# Patient Record
Sex: Female | Born: 1950 | Race: White | Hispanic: No | Marital: Married | Smoking: Current every day smoker
Health system: Southern US, Community
[De-identification: ages and names within clinical notes are randomized; demographics above are authoritative.]

## PROBLEM LIST (undated history)

## (undated) ENCOUNTER — Ambulatory Visit: Admission: EM | Payer: Medicare HMO | Source: Home / Self Care

## (undated) ENCOUNTER — Ambulatory Visit: Payer: Medicare HMO

## (undated) DIAGNOSIS — D649 Anemia, unspecified: Secondary | ICD-10-CM

## (undated) DIAGNOSIS — R7303 Prediabetes: Secondary | ICD-10-CM

## (undated) DIAGNOSIS — E785 Hyperlipidemia, unspecified: Secondary | ICD-10-CM

## (undated) DIAGNOSIS — I10 Essential (primary) hypertension: Secondary | ICD-10-CM

## (undated) DIAGNOSIS — M1990 Unspecified osteoarthritis, unspecified site: Secondary | ICD-10-CM

## (undated) DIAGNOSIS — E559 Vitamin D deficiency, unspecified: Secondary | ICD-10-CM

## (undated) DIAGNOSIS — T8859XA Other complications of anesthesia, initial encounter: Secondary | ICD-10-CM

## (undated) DIAGNOSIS — C801 Malignant (primary) neoplasm, unspecified: Secondary | ICD-10-CM

## (undated) DIAGNOSIS — T7840XA Allergy, unspecified, initial encounter: Secondary | ICD-10-CM

## (undated) DIAGNOSIS — F419 Anxiety disorder, unspecified: Secondary | ICD-10-CM

## (undated) DIAGNOSIS — K219 Gastro-esophageal reflux disease without esophagitis: Secondary | ICD-10-CM

## (undated) HISTORY — PX: COLON SURGERY: SHX602

## (undated) HISTORY — PX: EYE SURGERY: SHX253

## (undated) HISTORY — PX: KNEE ARTHROSCOPY W/ ACL RECONSTRUCTION: SHX1858

## (undated) HISTORY — PX: ACHILLES TENDON REPAIR: SUR1153

---

## 1997-12-17 ENCOUNTER — Other Ambulatory Visit: Admission: RE | Admit: 1997-12-17 | Discharge: 1997-12-17 | Payer: Self-pay | Admitting: Family Medicine

## 1998-08-31 ENCOUNTER — Other Ambulatory Visit: Admission: RE | Admit: 1998-08-31 | Discharge: 1998-08-31 | Payer: Self-pay | Admitting: *Deleted

## 2002-10-09 ENCOUNTER — Ambulatory Visit (HOSPITAL_COMMUNITY): Admission: RE | Admit: 2002-10-09 | Discharge: 2002-10-09 | Payer: Self-pay | Admitting: *Deleted

## 2003-12-03 ENCOUNTER — Other Ambulatory Visit: Admission: RE | Admit: 2003-12-03 | Discharge: 2003-12-03 | Payer: Self-pay | Admitting: Family Medicine

## 2004-05-22 ENCOUNTER — Emergency Department (HOSPITAL_COMMUNITY): Admission: EM | Admit: 2004-05-22 | Discharge: 2004-05-22 | Payer: Self-pay | Admitting: Emergency Medicine

## 2004-10-12 ENCOUNTER — Ambulatory Visit: Payer: Self-pay | Admitting: Pulmonary Disease

## 2004-10-20 ENCOUNTER — Ambulatory Visit: Payer: Self-pay | Admitting: Pulmonary Disease

## 2004-12-13 ENCOUNTER — Other Ambulatory Visit: Admission: RE | Admit: 2004-12-13 | Discharge: 2004-12-13 | Payer: Self-pay | Admitting: Family Medicine

## 2005-06-29 ENCOUNTER — Other Ambulatory Visit: Admission: RE | Admit: 2005-06-29 | Discharge: 2005-06-29 | Payer: Self-pay | Admitting: Family Medicine

## 2006-01-02 ENCOUNTER — Other Ambulatory Visit: Admission: RE | Admit: 2006-01-02 | Discharge: 2006-01-02 | Payer: Self-pay | Admitting: Family Medicine

## 2006-09-14 ENCOUNTER — Other Ambulatory Visit: Admission: RE | Admit: 2006-09-14 | Discharge: 2006-09-14 | Payer: Self-pay | Admitting: Family Medicine

## 2007-06-07 ENCOUNTER — Other Ambulatory Visit: Admission: RE | Admit: 2007-06-07 | Discharge: 2007-06-07 | Payer: Self-pay | Admitting: Family Medicine

## 2007-11-26 ENCOUNTER — Other Ambulatory Visit: Admission: RE | Admit: 2007-11-26 | Discharge: 2007-11-26 | Payer: Self-pay | Admitting: Family Medicine

## 2010-03-01 ENCOUNTER — Other Ambulatory Visit: Admission: RE | Admit: 2010-03-01 | Discharge: 2010-03-01 | Payer: Self-pay | Admitting: Family Medicine

## 2011-03-07 ENCOUNTER — Other Ambulatory Visit (HOSPITAL_COMMUNITY)
Admission: RE | Admit: 2011-03-07 | Discharge: 2011-03-07 | Disposition: A | Discharge status: Home / Self Care | Payer: BC Managed Care – PPO | Source: Ambulatory Visit | Attending: Family Medicine | Admitting: Family Medicine

## 2011-03-07 ENCOUNTER — Other Ambulatory Visit: Payer: Self-pay | Admitting: Family Medicine

## 2011-03-07 DIAGNOSIS — Z124 Encounter for screening for malignant neoplasm of cervix: Secondary | ICD-10-CM | POA: Insufficient documentation

## 2012-03-28 ENCOUNTER — Ambulatory Visit: Payer: Self-pay | Admitting: Podiatry

## 2015-08-09 ENCOUNTER — Other Ambulatory Visit: Payer: Self-pay | Admitting: Obstetrics & Gynecology

## 2015-08-09 DIAGNOSIS — M8580 Other specified disorders of bone density and structure, unspecified site: Secondary | ICD-10-CM

## 2015-08-19 ENCOUNTER — Ambulatory Visit: Payer: Self-pay | Attending: Obstetrics & Gynecology

## 2016-12-21 ENCOUNTER — Other Ambulatory Visit: Payer: Self-pay | Admitting: Obstetrics & Gynecology

## 2016-12-21 DIAGNOSIS — Z1231 Encounter for screening mammogram for malignant neoplasm of breast: Secondary | ICD-10-CM

## 2017-01-12 ENCOUNTER — Ambulatory Visit
Admission: RE | Admit: 2017-01-12 | Discharge: 2017-01-12 | Disposition: A | Discharge status: Home / Self Care | Payer: BLUE CROSS/BLUE SHIELD | Source: Ambulatory Visit | Attending: Obstetrics & Gynecology | Admitting: Obstetrics & Gynecology

## 2017-01-12 ENCOUNTER — Encounter: Payer: Self-pay | Admitting: Radiology

## 2017-01-12 DIAGNOSIS — Z1231 Encounter for screening mammogram for malignant neoplasm of breast: Secondary | ICD-10-CM | POA: Diagnosis present

## 2017-01-18 DIAGNOSIS — E559 Vitamin D deficiency, unspecified: Secondary | ICD-10-CM | POA: Insufficient documentation

## 2017-01-18 DIAGNOSIS — Z720 Tobacco use: Secondary | ICD-10-CM | POA: Insufficient documentation

## 2017-01-18 DIAGNOSIS — Z87891 Personal history of nicotine dependence: Secondary | ICD-10-CM | POA: Insufficient documentation

## 2017-04-04 DIAGNOSIS — G8929 Other chronic pain: Secondary | ICD-10-CM | POA: Insufficient documentation

## 2018-04-17 ENCOUNTER — Encounter (HOSPITAL_COMMUNITY): Payer: Self-pay | Admitting: Emergency Medicine

## 2018-04-17 ENCOUNTER — Emergency Department (HOSPITAL_COMMUNITY): Payer: Medicare Other

## 2018-04-17 ENCOUNTER — Emergency Department (HOSPITAL_COMMUNITY)
Admission: EM | Admit: 2018-04-17 | Discharge: 2018-04-17 | Disposition: A | Discharge status: Home / Self Care | Payer: Medicare Other | Attending: Emergency Medicine | Admitting: Emergency Medicine

## 2018-04-17 DIAGNOSIS — R0789 Other chest pain: Secondary | ICD-10-CM | POA: Insufficient documentation

## 2018-04-17 DIAGNOSIS — Z7982 Long term (current) use of aspirin: Secondary | ICD-10-CM | POA: Insufficient documentation

## 2018-04-17 DIAGNOSIS — Z79899 Other long term (current) drug therapy: Secondary | ICD-10-CM | POA: Diagnosis not present

## 2018-04-17 DIAGNOSIS — F17200 Nicotine dependence, unspecified, uncomplicated: Secondary | ICD-10-CM | POA: Diagnosis not present

## 2018-04-17 LAB — BASIC METABOLIC PANEL
Anion gap: 10 (ref 5–15)
BUN: 13 mg/dL (ref 8–23)
CO2: 22 mmol/L (ref 22–32)
Calcium: 9.4 mg/dL (ref 8.9–10.3)
Chloride: 109 mmol/L (ref 98–111)
Creatinine, Ser: 0.9 mg/dL (ref 0.44–1.00)
GFR calc Af Amer: >60 mL/min (ref >60)
GFR calc non Af Amer: >60 mL/min (ref >60)
Glucose, Bld: 179 mg/dL — ABNORMAL HIGH (ref 70–99)
Potassium: 3.8 mmol/L (ref 3.5–5.1)
Sodium: 141 mmol/L (ref 135–145)

## 2018-04-17 LAB — CBC
HCT: 50.3 % — ABNORMAL HIGH (ref 36.0–46.0)
Hemoglobin: 16.7 g/dL — ABNORMAL HIGH (ref 12.0–15.0)
MCH: 33.2 pg (ref 26.0–34.0)
MCHC: 33.2 g/dL (ref 30.0–36.0)
MCV: 100 fL (ref 78.0–100.0)
Platelets: 231 10*3/uL (ref 150–400)
RBC: 5.03 MIL/uL (ref 3.87–5.11)
RDW: 12.7 % (ref 11.5–15.5)
WBC: 10.7 10*3/uL — ABNORMAL HIGH (ref 4.0–10.5)

## 2018-04-17 LAB — I-STAT TROPONIN, ED: Troponin i, poc: 0.01 ng/mL (ref 0.00–0.08)

## 2018-04-17 LAB — TROPONIN I: Troponin I: <0.03 ng/mL (ref <0.03)

## 2018-04-17 MED ORDER — IOPAMIDOL (ISOVUE-370) INJECTION 76%
100.0000 mL | Freq: Once | INTRAVENOUS | Status: AC | PRN
  Administered 2018-04-17: 100 mL via INTRAVENOUS

## 2018-04-17 MED ORDER — IOPAMIDOL (ISOVUE-370) INJECTION 76%
INTRAVENOUS | Status: AC
  Filled 2018-04-17: qty 100

## 2018-04-17 MED ORDER — ALPRAZOLAM 0.25 MG PO TABS: 0.2500 mg | ORAL_TABLET | Freq: Two times a day (BID) | ORAL | 0 refills | Status: DC | PRN

## 2018-04-17 MED ORDER — CYCLOBENZAPRINE HCL 10 MG PO TABS
5.0000 mg | ORAL_TABLET | Freq: Once | ORAL | Status: AC
  Administered 2018-04-17: 5 mg via ORAL
  Filled 2018-04-17: qty 1

## 2018-04-17 NOTE — ED Notes (Signed)
Brenda(RN) stated pt needs IV and will draw blood.

## 2018-04-17 NOTE — ED Provider Notes (Signed)
[facility] EMERGENCY DEPARTMENT Provider Note   CSN: [id number] Arrival date & time: 04/17/18  1319     History   Chief Complaint Chief Complaint  Patient presents with  . Chest Pain    HPI Neisha TAJ ARTEAGA is a 67 y.o. female history of chest pain.  Patient states that she has some substernal chest pain started at 10 AM this morning.  She states that it is worse with exertion also pleuritic in nature.  Patient denies any abdominal pain or vomiting or fevers.  Patient denies any recent travel or leg swelling or history of blood clots.  Patient denies any history of coronary artery disease or stents.  Patient was noted to be tachycardic in triage.  The history is provided by the patient.    History reviewed. No pertinent past medical history.  There are no active problems to display for this patient.   History reviewed. No pertinent surgical history.   OB History   None      Home Medications    Prior to Admission medications   Medication Sig Start Date End Date Taking? Authorizing Provider  aspirin 81 MG chewable tablet Chew 81 mg by mouth once as needed (for sudden onset of chest pain).    Yes [provider]  Cholecalciferol (VITAMIN D3) 5000 units CAPS Take 5,000 Units by mouth at bedtime.   Yes [provider]  ibuprofen (ADVIL,MOTRIN) 200 MG tablet Take 200 mg by mouth every 6 (six) hours as needed (for pain or headaches).   Yes [provider]  NON FORMULARY Place 5 drops under the tongue See admin instructions. CBD oil (contains NO "THC"): Place 5 drops under the tongue in the morning and 5 drops at bedtime   Yes [provider]  Propylene Glycol (SYSTANE BALANCE OP) Place 1-2 drops into both eyes 2 (two) times daily as needed (for dryness or irritation).   Yes [provider]  simvastatin (ZOCOR) 20 MG tablet Take 20 mg by mouth at bedtime. 01/18/18  Yes [provider]    Family History No  family history on file.  Social History Social History   Tobacco Use  . Smoking status: Current Every Day Smoker  . Smokeless tobacco: Never Used  Substance Use Topics  . Alcohol use: Never    Frequency: Never  . Drug use: Not on file     Allergies   Patient has no known allergies.   Review of Systems Review of Systems  Cardiovascular: Positive for chest pain.  All other systems reviewed and are negative.    Physical Exam Updated Vital Signs BP 140/82   Pulse 73   Temp 98.3 F (36.8 C) (Oral)   Resp 20   SpO2 98%   Physical Exam  Constitutional: She appears well-developed and well-nourished.  HENT:  Head: Normocephalic.  Eyes: Pupils are equal, round, and reactive to light. EOM are normal.  Neck: Normal range of motion.  Cardiovascular: Normal rate, regular rhythm and normal pulses.  Pulmonary/Chest: Effort normal and breath sounds normal.  ? Reproducible tenderness   Abdominal: Soft. Bowel sounds are normal.  Musculoskeletal: Normal range of motion.       Right lower leg: Normal.       Left lower leg: Normal.  Neurological: She is alert.  Skin: Skin is warm. Capillary refill takes less than 2 seconds.  Psychiatric: She has a normal mood and affect. Her behavior is normal.  Nursing note and vitals reviewed.  ED Treatments / Results  Labs (all labs ordered are listed, but only abnormal results are displayed) Labs Reviewed  BASIC METABOLIC PANEL - Abnormal; Notable for the following components:      Result Value   Glucose, Bld 179 (*)    All other components within normal limits  CBC - Abnormal; Notable for the following components:   WBC 10.7 (*)    Hemoglobin 16.7 (*)    HCT 50.3 (*)    All other components within normal limits  TROPONIN I  I-STAT TROPONIN, ED  I-STAT TROPONIN, ED    EKG EKG Interpretation  Date/Time:  Wednesday April 17 2018 13:25:36 EDT Ventricular Rate:  101 PR Interval:  138 QRS Duration: 78 QT Interval:  334 QTC  Calculation: 433 R Axis:   86 Text Interpretation:  Sinus tachycardia Otherwise normal ECG No previous ECGs available Confirmed by Wandra Arthurs [phone]) on 04/17/2018 3:13:16 PM   Radiology Dg Chest 2 View  Result Date: 04/17/2018 CLINICAL DATA:  Chest pain and pressure EXAM: CHEST - 2 VIEW COMPARISON:  None. FINDINGS: The heart size and mediastinal contours are within normal limits. Both lungs are clear. The visualized skeletal structures are unremarkable. IMPRESSION: No active cardiopulmonary disease. Electronically Signed   By: Inez Catalina M.D.   On: 04/17/2018 14:29   Ct Angio Chest Pe W And/or Wo Contrast  Result Date: 04/17/2018 CLINICAL DATA:  Chest pain and shortness of breath. Evaluate for pulmonary embolism. EXAM: CT ANGIOGRAPHY CHEST WITH CONTRAST TECHNIQUE: Multidetector CT imaging of the chest was performed using the standard protocol during bolus administration of intravenous contrast. Multiplanar CT image reconstructions and MIPs were obtained to evaluate the vascular anatomy. CONTRAST:  158mL ISOVUE-370 IOPAMIDOL (ISOVUE-370) COMPARISON:  Chest radiograph-earlier same date FINDINGS: Vascular Findings: There is adequate opacification of the pulmonary arterial system with the main pulmonary artery measuring 450 Hounsfield units. There are no discrete filling defects within the pulmonary arterial tree to suggest pulmonary embolism. Normal caliber of the main pulmonary artery. Cardiomegaly. Coronary artery calcifications. There is a small amount of fluid within the pericardial recess. No pericardial effusion. Normal caliber the thoracic aorta. Review of the MIP images confirms the above findings. ---------------------------------------------------------------------------------- Nonvascular Findings: Mediastinum/Lymph Nodes: Scattered mediastinal and hilar lymph nodes are numerous though individually not enlarged by size criteria with index subcarinal lymph node measuring 0.8 cm in greatest  short axis diameter (image 45, series 5) and index right infrahilar lymph node measuring 0.6 cm (image 60, series 5), presumably reactive in etiology. No bulky mediastinal, hilar or axillary lymphadenopathy. Lungs/Pleura: Scattered ill-defined areas of ground-glass favored to represent areas of air trapping. Minimal dependent subpleural ground-glass atelectasis. No discrete focal airspace opacities. No pleural effusion or pneumothorax. The central pulmonary airways appear widely patent. No discrete pulmonary nodules. Upper abdomen: Limited early arterial phase evaluation of the upper abdomen suggests hepatomegaly. Musculoskeletal: No acute or aggressive osseous abnormalities. Stigmata of DISH within the thoracic spine. Regional soft tissues appear normal. Normal appearance of the imaged portions of the thyroid gland. IMPRESSION: 1. No explanation for patient's chest pain and shortness of breath. Specifically, no evidence of pulmonary embolism. 2. Cardiomegaly.  Coronary artery calcifications. 3.  Aortic Atherosclerosis (ICD10-I70.0). Electronically Signed   By: Sandi Mariscal M.D.   On: 04/17/2018 16:57    Procedures Procedures (including critical care time)  Medications Ordered in ED Medications  iopamidol (ISOVUE-370) 76 % injection (has no administration in time range)  cyclobenzaprine (FLEXERIL) tablet 5 mg (5 mg Oral Given  04/17/18 1547)  iopamidol (ISOVUE-370) 76 % injection 100 mL (100 mLs Intravenous Contrast Given 04/17/18 1619)     Initial Impression / Assessment and Plan / ED Course  I have reviewed the triage vital signs and the nursing notes.  Pertinent labs & imaging results that were available during my care of the patient were reviewed by me and considered in my medical decision making (see chart for details).    Amonda CARLIN MAMONE is a 67 y.o. female here with chest pain, tachycardia. Consider ACS vs PE vs MSK pain. Will get trop x 2, CTA chest.   6:13 PM CT showed no PE. Delta trop  neg. I think likely MSK pain vs anxiety. Stable for discharge.   Final Clinical Impressions(s) / ED Diagnoses   Final diagnoses:  None    ED Discharge Orders    None       Drenda Freeze, MD 04/17/18 [phone]

## 2018-04-17 NOTE — Discharge Instructions (Signed)
Take motrin for pain.   Take xanax as needed for anxiety.   See your doctor. Consider stress test if you have persistent pain   Return to ER if you have worse chest pain, shortness of breath.

## 2018-04-17 NOTE — ED Triage Notes (Signed)
Pt to ER for evaluation of sudden onset central chest pressure while at rest 1 hour ago. Pt reports shortness of breath. Hx of high cholesterol and is current everyday smoker. HR 110-115. Pt in NAD at this time.

## 2018-06-21 DIAGNOSIS — I700 Atherosclerosis of aorta: Secondary | ICD-10-CM | POA: Insufficient documentation

## 2018-12-25 ENCOUNTER — Other Ambulatory Visit: Payer: Self-pay | Admitting: Obstetrics & Gynecology

## 2018-12-25 DIAGNOSIS — Z1231 Encounter for screening mammogram for malignant neoplasm of breast: Secondary | ICD-10-CM

## 2019-02-27 ENCOUNTER — Other Ambulatory Visit: Payer: Self-pay

## 2019-02-27 ENCOUNTER — Ambulatory Visit
Admission: RE | Admit: 2019-02-27 | Discharge: 2019-02-27 | Disposition: A | Discharge status: Home / Self Care | Payer: Medicare Other | Source: Ambulatory Visit | Attending: Obstetrics & Gynecology | Admitting: Obstetrics & Gynecology

## 2019-02-27 DIAGNOSIS — Z1231 Encounter for screening mammogram for malignant neoplasm of breast: Secondary | ICD-10-CM

## 2019-08-14 IMAGING — DX DG CHEST 2V
2 series · 2 of 2 positions shown · non-contrast
Comparison: None.

CLINICAL DATA: Chest pain and pressure

EXAM:
CHEST - 2 VIEW

[chest pa]
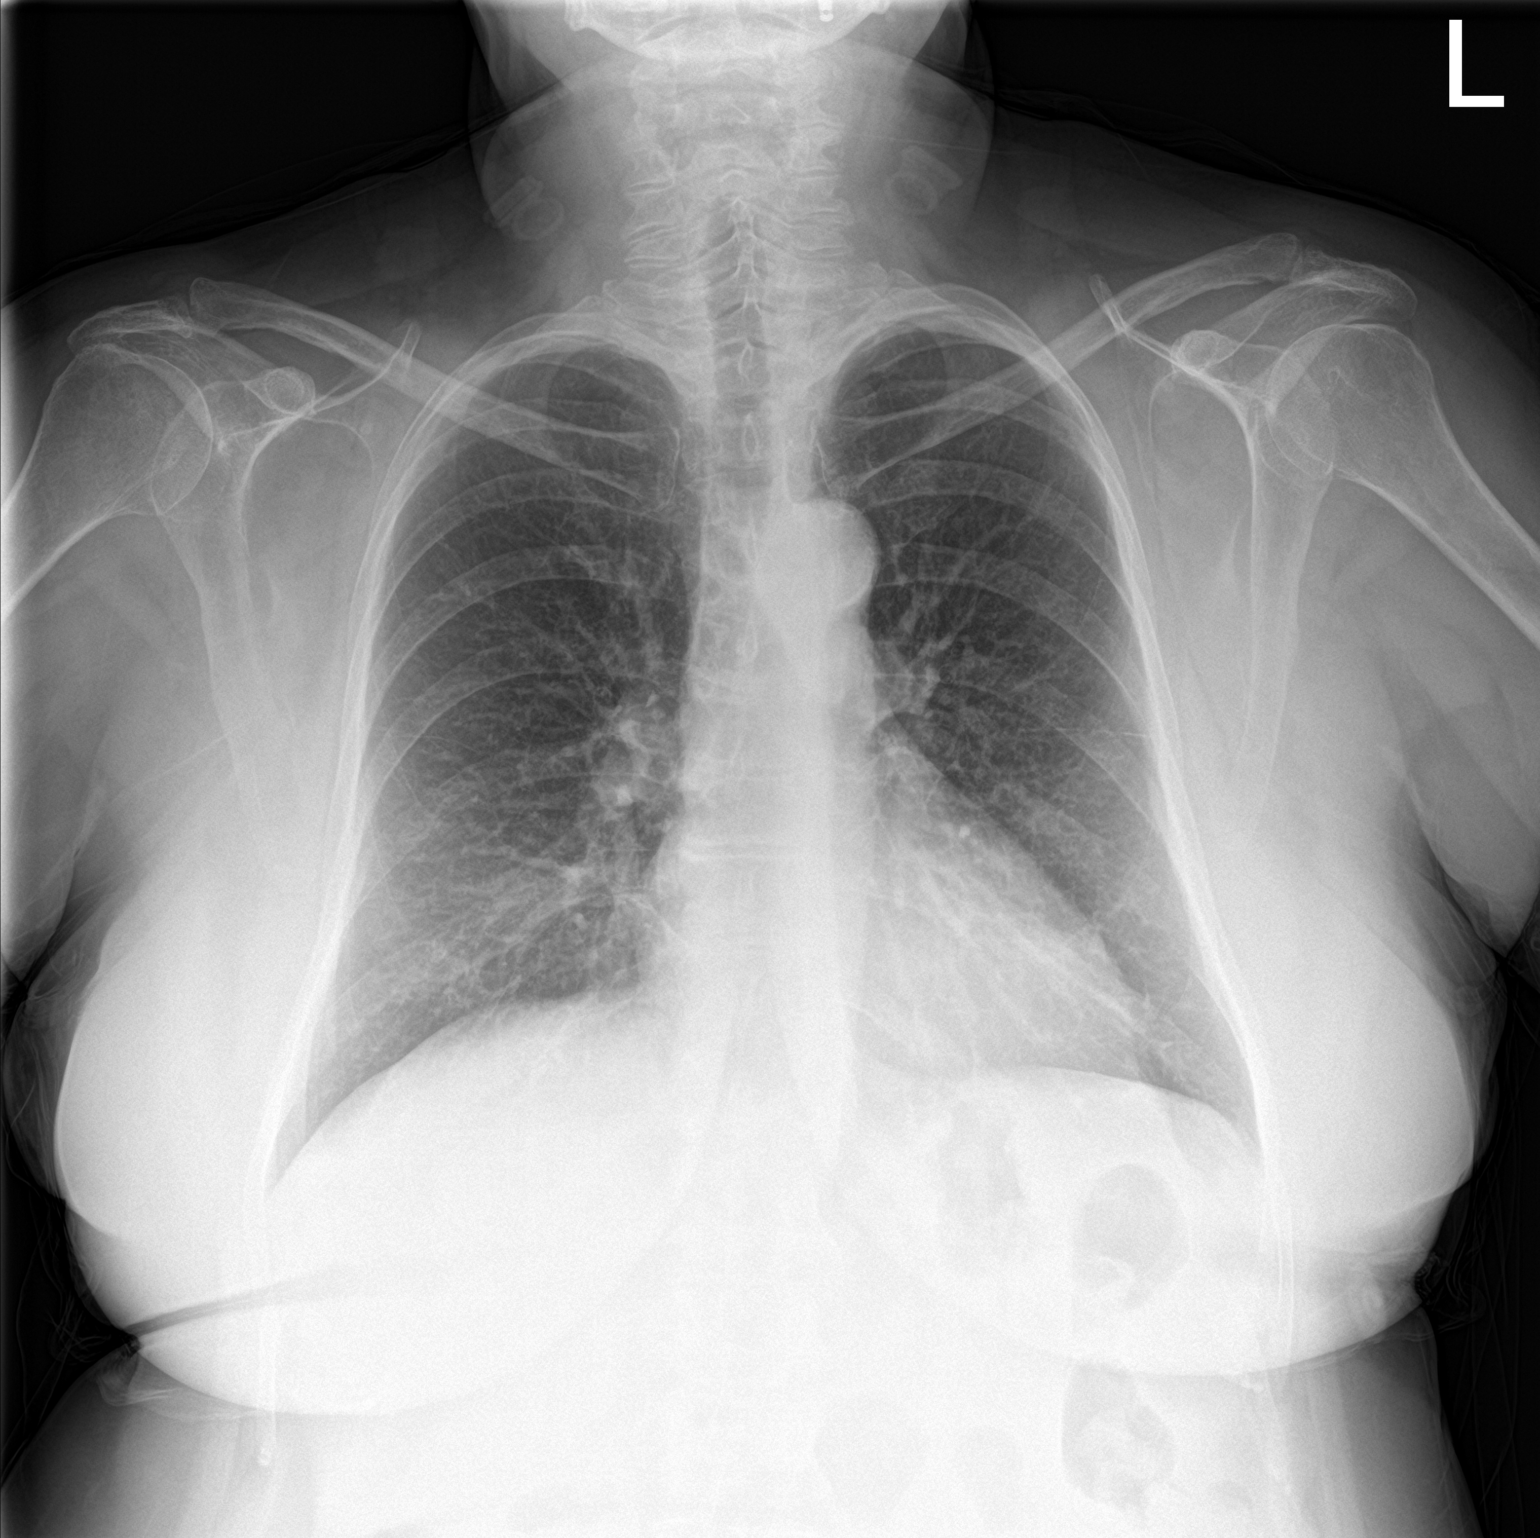

[chest lat]
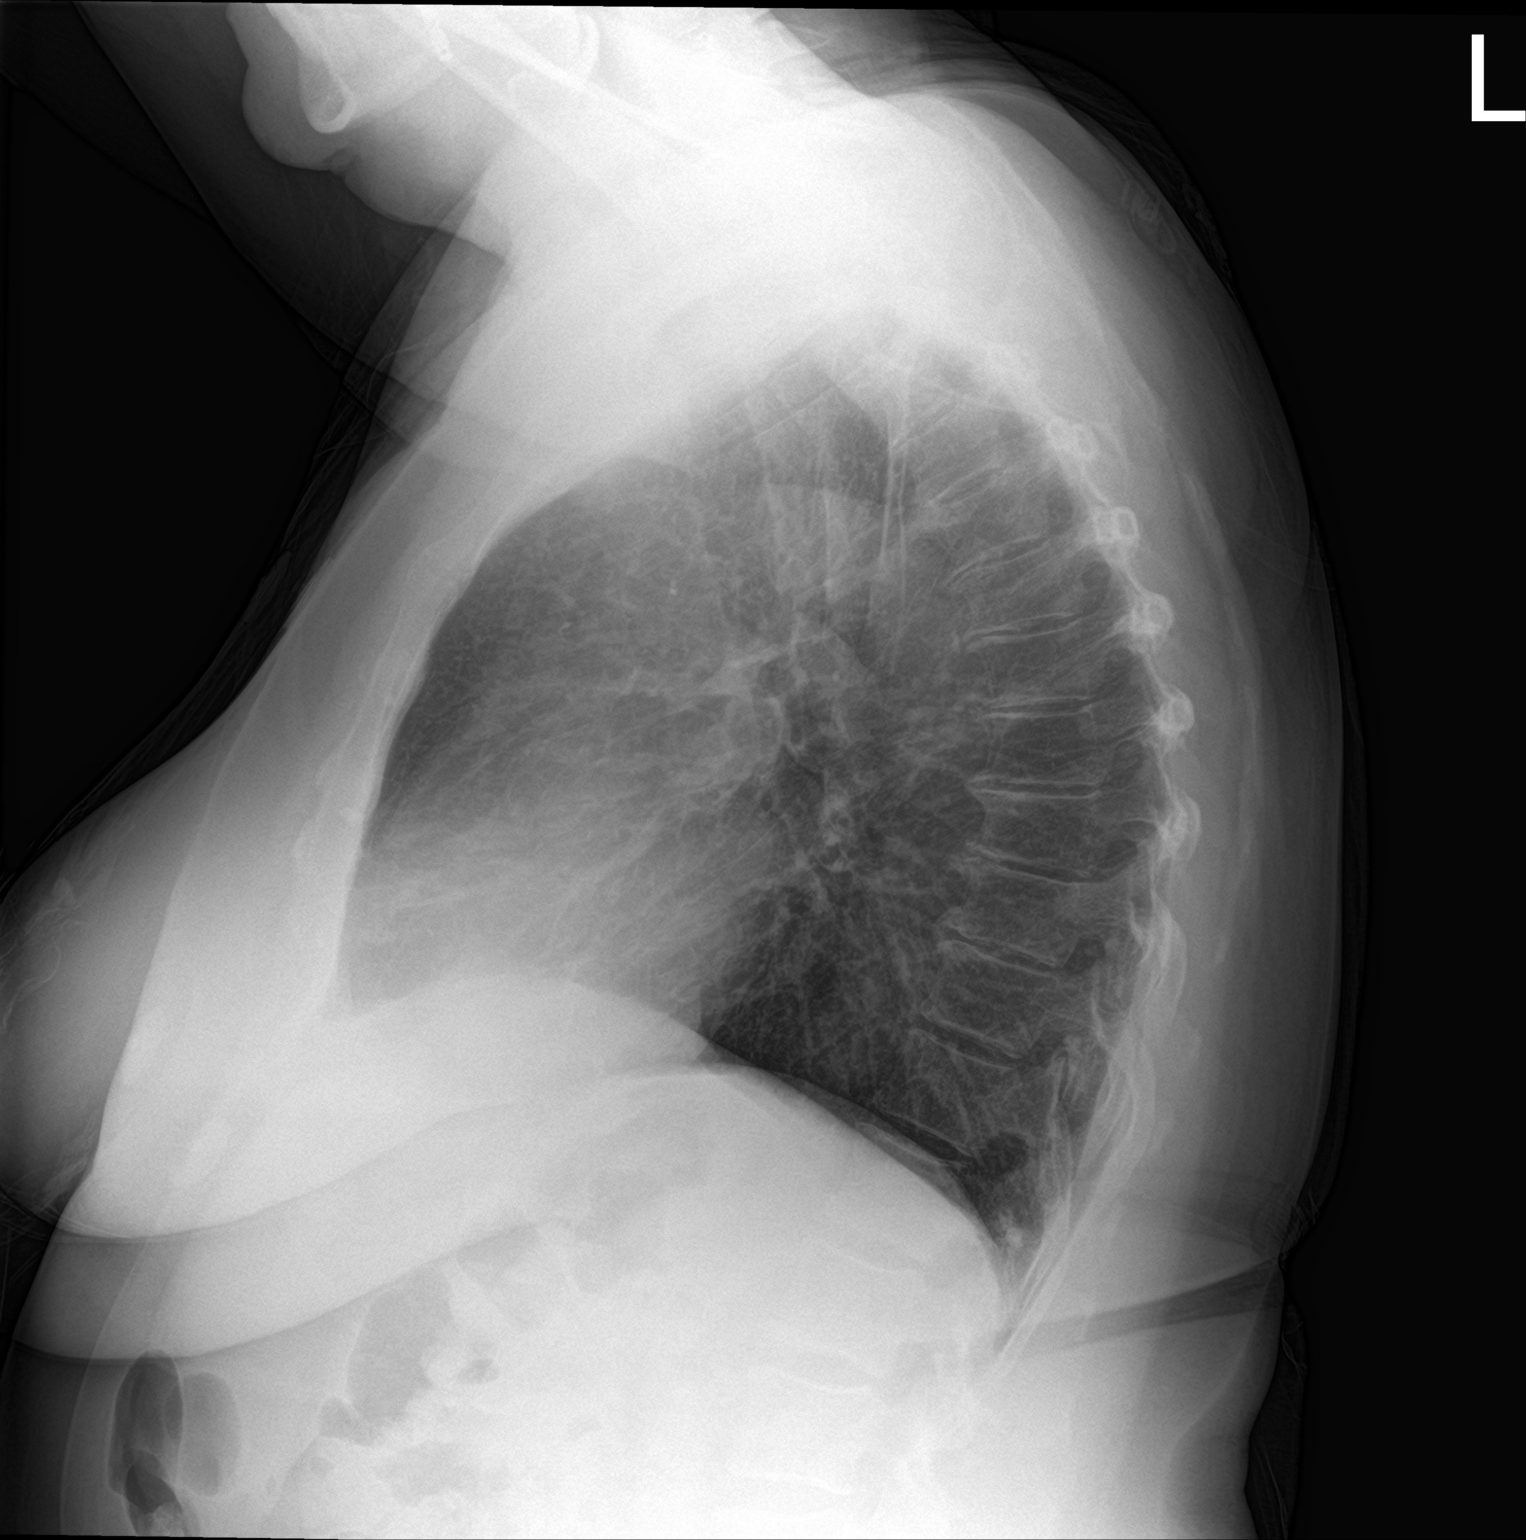

[2 of 2 positions shown; findings below may reference images not displayed]

FINDINGS: The heart size and mediastinal contours are within normal limits.
Both lungs are clear. The visualized skeletal structures are
unremarkable.
IMPRESSION: No active cardiopulmonary disease.

## 2019-09-02 ENCOUNTER — Ambulatory Visit: Payer: Medicare Other | Attending: Internal Medicine

## 2019-09-02 DIAGNOSIS — Z20822 Contact with and (suspected) exposure to covid-19: Secondary | ICD-10-CM

## 2019-09-04 LAB — NOVEL CORONAVIRUS, NAA: SARS-CoV-2, NAA: NOT DETECTED

## 2020-06-16 ENCOUNTER — Other Ambulatory Visit: Payer: Self-pay | Admitting: Obstetrics & Gynecology

## 2020-06-16 DIAGNOSIS — Z1231 Encounter for screening mammogram for malignant neoplasm of breast: Secondary | ICD-10-CM

## 2020-07-23 ENCOUNTER — Ambulatory Visit
Admission: RE | Admit: 2020-07-23 | Discharge: 2020-07-23 | Disposition: A | Discharge status: Home / Self Care | Payer: Medicare Other | Source: Ambulatory Visit | Attending: Obstetrics & Gynecology | Admitting: Obstetrics & Gynecology

## 2020-07-23 ENCOUNTER — Other Ambulatory Visit: Payer: Self-pay

## 2020-07-23 DIAGNOSIS — Z1231 Encounter for screening mammogram for malignant neoplasm of breast: Secondary | ICD-10-CM | POA: Insufficient documentation

## 2020-08-24 DIAGNOSIS — Z0100 Encounter for examination of eyes and vision without abnormal findings: Secondary | ICD-10-CM | POA: Diagnosis not present

## 2020-08-24 DIAGNOSIS — H524 Presbyopia: Secondary | ICD-10-CM | POA: Diagnosis not present

## 2020-09-01 DIAGNOSIS — Z20822 Contact with and (suspected) exposure to covid-19: Secondary | ICD-10-CM | POA: Diagnosis not present

## 2020-11-21 DIAGNOSIS — Z03818 Encounter for observation for suspected exposure to other biological agents ruled out: Secondary | ICD-10-CM | POA: Diagnosis not present

## 2020-11-21 DIAGNOSIS — J302 Other seasonal allergic rhinitis: Secondary | ICD-10-CM | POA: Diagnosis not present

## 2020-11-21 DIAGNOSIS — Z20822 Contact with and (suspected) exposure to covid-19: Secondary | ICD-10-CM | POA: Diagnosis not present

## 2020-11-21 DIAGNOSIS — J0140 Acute pansinusitis, unspecified: Secondary | ICD-10-CM | POA: Diagnosis not present

## 2020-12-07 DIAGNOSIS — Z720 Tobacco use: Secondary | ICD-10-CM | POA: Diagnosis not present

## 2020-12-07 DIAGNOSIS — E7849 Other hyperlipidemia: Secondary | ICD-10-CM | POA: Diagnosis not present

## 2020-12-07 DIAGNOSIS — Z0000 Encounter for general adult medical examination without abnormal findings: Secondary | ICD-10-CM | POA: Diagnosis not present

## 2020-12-07 DIAGNOSIS — I700 Atherosclerosis of aorta: Secondary | ICD-10-CM | POA: Diagnosis not present

## 2020-12-12 DIAGNOSIS — Z03818 Encounter for observation for suspected exposure to other biological agents ruled out: Secondary | ICD-10-CM | POA: Diagnosis not present

## 2020-12-12 DIAGNOSIS — Z20822 Contact with and (suspected) exposure to covid-19: Secondary | ICD-10-CM | POA: Diagnosis not present

## 2020-12-12 DIAGNOSIS — J0141 Acute recurrent pansinusitis: Secondary | ICD-10-CM | POA: Diagnosis not present

## 2020-12-14 DIAGNOSIS — E559 Vitamin D deficiency, unspecified: Secondary | ICD-10-CM | POA: Diagnosis not present

## 2020-12-14 DIAGNOSIS — R1084 Generalized abdominal pain: Secondary | ICD-10-CM | POA: Diagnosis not present

## 2020-12-14 DIAGNOSIS — E785 Hyperlipidemia, unspecified: Secondary | ICD-10-CM | POA: Diagnosis not present

## 2020-12-14 DIAGNOSIS — I700 Atherosclerosis of aorta: Secondary | ICD-10-CM | POA: Diagnosis not present

## 2020-12-14 DIAGNOSIS — Z720 Tobacco use: Secondary | ICD-10-CM | POA: Diagnosis not present

## 2020-12-14 DIAGNOSIS — Z7982 Long term (current) use of aspirin: Secondary | ICD-10-CM | POA: Diagnosis not present

## 2020-12-21 ENCOUNTER — Other Ambulatory Visit: Payer: Self-pay | Admitting: Gastroenterology

## 2020-12-21 DIAGNOSIS — I700 Atherosclerosis of aorta: Secondary | ICD-10-CM | POA: Diagnosis not present

## 2020-12-21 DIAGNOSIS — Z1211 Encounter for screening for malignant neoplasm of colon: Secondary | ICD-10-CM | POA: Diagnosis not present

## 2020-12-21 DIAGNOSIS — R1319 Other dysphagia: Secondary | ICD-10-CM | POA: Diagnosis not present

## 2020-12-21 DIAGNOSIS — R634 Abnormal weight loss: Secondary | ICD-10-CM | POA: Diagnosis not present

## 2020-12-21 DIAGNOSIS — R1084 Generalized abdominal pain: Secondary | ICD-10-CM

## 2020-12-21 DIAGNOSIS — K219 Gastro-esophageal reflux disease without esophagitis: Secondary | ICD-10-CM | POA: Diagnosis not present

## 2020-12-21 DIAGNOSIS — R6881 Early satiety: Secondary | ICD-10-CM | POA: Diagnosis not present

## 2020-12-30 ENCOUNTER — Other Ambulatory Visit: Payer: Self-pay

## 2020-12-30 ENCOUNTER — Ambulatory Visit
Admission: RE | Admit: 2020-12-30 | Discharge: 2020-12-30 | Disposition: A | Discharge status: Home / Self Care | Payer: Medicare HMO | Source: Ambulatory Visit | Attending: Gastroenterology | Admitting: Gastroenterology

## 2020-12-30 DIAGNOSIS — R634 Abnormal weight loss: Secondary | ICD-10-CM | POA: Diagnosis not present

## 2020-12-30 DIAGNOSIS — R109 Unspecified abdominal pain: Secondary | ICD-10-CM | POA: Diagnosis not present

## 2020-12-30 DIAGNOSIS — R1084 Generalized abdominal pain: Secondary | ICD-10-CM | POA: Insufficient documentation

## 2020-12-30 DIAGNOSIS — I700 Atherosclerosis of aorta: Secondary | ICD-10-CM

## 2020-12-30 MED ORDER — IOHEXOL 300 MG/ML  SOLN
100.0000 mL | Freq: Once | INTRAMUSCULAR | Status: AC | PRN
  Administered 2020-12-30: 100 mL via INTRAVENOUS

## 2020-12-31 ENCOUNTER — Encounter: Payer: Self-pay | Admitting: Internal Medicine

## 2021-01-03 ENCOUNTER — Ambulatory Visit
Admission: RE | Admit: 2021-01-03 | Discharge: 2021-01-03 | Disposition: A | Discharge status: Home / Self Care | Payer: Medicare HMO | Attending: Internal Medicine | Admitting: Internal Medicine

## 2021-01-03 ENCOUNTER — Encounter
Admission: RE | Disposition: A | Discharge status: Home / Self Care | Payer: Self-pay | Source: Home / Self Care | Attending: Internal Medicine

## 2021-01-03 ENCOUNTER — Ambulatory Visit: Payer: Medicare HMO | Admitting: Anesthesiology

## 2021-01-03 ENCOUNTER — Encounter: Payer: Self-pay | Admitting: Internal Medicine

## 2021-01-03 DIAGNOSIS — Z79899 Other long term (current) drug therapy: Secondary | ICD-10-CM | POA: Diagnosis not present

## 2021-01-03 DIAGNOSIS — K648 Other hemorrhoids: Secondary | ICD-10-CM | POA: Diagnosis not present

## 2021-01-03 DIAGNOSIS — K2289 Other specified disease of esophagus: Secondary | ICD-10-CM | POA: Insufficient documentation

## 2021-01-03 DIAGNOSIS — K5669 Other partial intestinal obstruction: Secondary | ICD-10-CM | POA: Diagnosis not present

## 2021-01-03 DIAGNOSIS — K631 Perforation of intestine (nontraumatic): Secondary | ICD-10-CM | POA: Diagnosis not present

## 2021-01-03 DIAGNOSIS — R1314 Dysphagia, pharyngoesophageal phase: Secondary | ICD-10-CM | POA: Diagnosis not present

## 2021-01-03 DIAGNOSIS — K621 Rectal polyp: Secondary | ICD-10-CM | POA: Diagnosis not present

## 2021-01-03 DIAGNOSIS — R1084 Generalized abdominal pain: Secondary | ICD-10-CM | POA: Diagnosis present

## 2021-01-03 DIAGNOSIS — K219 Gastro-esophageal reflux disease without esophagitis: Secondary | ICD-10-CM | POA: Diagnosis not present

## 2021-01-03 DIAGNOSIS — R635 Abnormal weight gain: Secondary | ICD-10-CM | POA: Insufficient documentation

## 2021-01-03 DIAGNOSIS — C182 Malignant neoplasm of ascending colon: Secondary | ICD-10-CM | POA: Insufficient documentation

## 2021-01-03 DIAGNOSIS — D128 Benign neoplasm of rectum: Secondary | ICD-10-CM | POA: Insufficient documentation

## 2021-01-03 DIAGNOSIS — K640 First degree hemorrhoids: Secondary | ICD-10-CM | POA: Diagnosis not present

## 2021-01-03 DIAGNOSIS — K449 Diaphragmatic hernia without obstruction or gangrene: Secondary | ICD-10-CM | POA: Diagnosis not present

## 2021-01-03 DIAGNOSIS — K6389 Other specified diseases of intestine: Secondary | ICD-10-CM | POA: Diagnosis not present

## 2021-01-03 DIAGNOSIS — K209 Esophagitis, unspecified without bleeding: Secondary | ICD-10-CM | POA: Diagnosis not present

## 2021-01-03 HISTORY — DX: Vitamin D deficiency, unspecified: E55.9

## 2021-01-03 HISTORY — DX: Unspecified osteoarthritis, unspecified site: M19.90

## 2021-01-03 HISTORY — PX: COLONOSCOPY WITH PROPOFOL: SHX5780

## 2021-01-03 HISTORY — PX: ESOPHAGOGASTRODUODENOSCOPY (EGD) WITH PROPOFOL: SHX5813

## 2021-01-03 HISTORY — DX: Gastro-esophageal reflux disease without esophagitis: K21.9

## 2021-01-03 HISTORY — DX: Allergy, unspecified, initial encounter: T78.40XA

## 2021-01-03 HISTORY — DX: Hyperlipidemia, unspecified: E78.5

## 2021-01-03 SURGERY — COLONOSCOPY WITH PROPOFOL
Anesthesia: General

## 2021-01-03 MED ORDER — LIDOCAINE 2% (20 MG/ML) 5 ML SYRINGE
INTRAMUSCULAR | Status: DC | PRN
  Administered 2021-01-03: 25 mg via INTRAVENOUS

## 2021-01-03 MED ORDER — SODIUM CHLORIDE 0.9 % IV SOLN: INTRAVENOUS | Status: DC

## 2021-01-03 MED ORDER — PROPOFOL 10 MG/ML IV BOLUS
INTRAVENOUS | Status: DC | PRN
  Administered 2021-01-03: 30 mg via INTRAVENOUS
  Administered 2021-01-03: 70 mg via INTRAVENOUS

## 2021-01-03 MED ORDER — EPHEDRINE SULFATE 50 MG/ML IJ SOLN
INTRAMUSCULAR | Status: DC | PRN
  Administered 2021-01-03 (×2): 10 mg via INTRAVENOUS

## 2021-01-03 MED ORDER — SPOT INK MARKER SYRINGE KIT
PACK | SUBMUCOSAL | Status: DC | PRN
  Administered 2021-01-03: 9 mL via SUBMUCOSAL

## 2021-01-03 MED ORDER — GLYCOPYRROLATE 0.2 MG/ML IJ SOLN
INTRAMUSCULAR | Status: DC | PRN
  Administered 2021-01-03: .2 mg via INTRAVENOUS

## 2021-01-03 MED ORDER — PROPOFOL 500 MG/50ML IV EMUL
INTRAVENOUS | Status: DC | PRN
  Administered 2021-01-03: 120 ug/kg/min via INTRAVENOUS

## 2021-01-03 NOTE — Transfer of Care (Signed)
Immediate Anesthesia Transfer of Care Note  Patient: Katie Liu  Procedure(s) Performed: COLONOSCOPY WITH PROPOFOL (N/A ) ESOPHAGOGASTRODUODENOSCOPY (EGD) WITH PROPOFOL (N/A )  Patient Location: PACU  Anesthesia Type:General  Level of Consciousness: sedated  Airway & Oxygen Therapy: Patient Spontanous Breathing and Patient connected to nasal cannula oxygen  Post-op Assessment: Report given to RN and Post -op Vital signs reviewed and stable  Post vital signs: Reviewed and stable  Last Vitals:  Vitals Value Taken Time  BP    Temp    Pulse 86 01/03/21 1510  Resp 29 01/03/21 1510  SpO2 98 % 01/03/21 1510  Vitals shown include unvalidated device data.  Last Pain:  Vitals:   01/03/21 1333  TempSrc: Tympanic  PainSc: 0-No pain         Complications: No complications documented.

## 2021-01-03 NOTE — H&P (Signed)
Outpatient short stay form Pre-procedure 01/03/2021 2:33 PM Katie Liu K. Katie Liu, M.D.  Primary Physician: Ramonita Lab III, M.D.  Reason for visit:  Postprandial diarrhea, abdominal pain, Sitophobia, weight loss. Abnormal CT scan of the abdomen.   History of present illness:  Patient is a pleasant 70 y/o female with > 20lb weight loss with postprandial abdominal pain and diarrhea. Had a CT showing thickened cecum suspicious for possible neoplasm. Patient has GERD controlled on medication with intermittent solid food dysphagia.    Current Facility-Administered Medications:  .  0.9 %  sodium chloride infusion, , Intravenous, Continuous, [place], Benay Pike, MD, Last Rate: 20 mL/hr at 01/03/21 1344, New Bag at 01/03/21 1344  Medications Prior to Admission  Medication Sig Dispense Refill Last Dose  . ALPRAZolam (XANAX) 0.25 MG tablet Take 1 tablet (0.25 mg total) by mouth 2 (two) times daily as needed for anxiety. 10 tablet 0 Past Month at Unknown time  . aspirin 81 MG chewable tablet Chew 81 mg by mouth once as needed (for sudden onset of chest pain).    Past Week at Unknown time  . atorvastatin (LIPITOR) 40 MG tablet Take 40 mg by mouth daily.   Past Week at Unknown time  . CANNABIDIOL PO Take by mouth.   Past Week at Unknown time  . Cholecalciferol (VITAMIN D3) 5000 units CAPS Take 5,000 Units by mouth at bedtime.   Past Week at Unknown time  . ibuprofen (ADVIL,MOTRIN) 200 MG tablet Take 200 mg by mouth every 6 (six) hours as needed (for pain or headaches).   Past Month at Unknown time  . Krill Oil 1000 MG CAPS Take 2,000 mg by mouth.   Past Week at Unknown time  . nicotine (NICODERM CQ - DOSED IN MG/24 HR) 7 mg/24hr patch Place 7 mg onto the skin daily.   01/02/2021 at Unknown time  . NON FORMULARY Place 5 drops under the tongue See admin instructions. CBD oil (contains NO "THC"): Place 5 drops under the tongue in the morning and 5 drops at bedtime   Past Week at Unknown time  . omeprazole  (PRILOSEC) 40 MG capsule Take 40 mg by mouth daily.   01/02/2021 at Unknown time  . Probiotic Product ([place]) Take by mouth.   Past Week at Unknown time  . Propylene Glycol (SYSTANE BALANCE OP) Place 1-2 drops into both eyes 2 (two) times daily as needed (for dryness or irritation).   Past Week at Unknown time  . sertraline (ZOLOFT) 25 MG tablet Take 25 mg by mouth daily.   Past Week at Unknown time  . simvastatin (ZOCOR) 20 MG tablet Take 20 mg by mouth at bedtime.  4 Past Week at Unknown time  . amoxicillin-clavulanate (AUGMENTIN) 400-57 MG/5ML suspension Take by mouth 2 (two) times daily. (Patient not taking: Reported on 01/03/2021)   Completed Course at Unknown time  . buPROPion (WELLBUTRIN SR) 150 MG 12 hr tablet Take 150 mg by mouth 2 (two) times daily. (Patient not taking: Reported on 01/03/2021)   Not Taking at Unknown time     No Known Allergies   Past Medical History:  Diagnosis Date  . Allergy   . Arthritis   . GERD (gastroesophageal reflux disease)   . Hyperlipemia   . Vitamin D deficiency     Review of systems:  Otherwise negative.    Physical Exam  Gen: Alert, oriented. Appears stated age.  HEENT: [place]/AT. PERRLA. Lungs: CTA, no wheezes. CV: RR nl S1, S2.  Abd: soft, benign, no masses. BS+ Ext: No edema. Pulses 2+    Planned procedures: Proceed with EGD and colonoscopy. The patient understands the nature of the planned procedure, indications, risks, alternatives and potential complications including but not limited to bleeding, infection, perforation, damage to internal organs and possible oversedation/side effects from anesthesia. The patient agrees and gives consent to proceed.  Please refer to procedure notes for findings, recommendations and patient disposition/instructions.     Katie Liu K. Katie Liu, M.D. Gastroenterology 01/03/2021  2:33 PM

## 2021-01-03 NOTE — Op Note (Signed)
[facility] Gastroenterology Patient Name: Katie Liu Procedure Date: 01/03/2021 2:41 PM MRN: [id number] Account #: [id number] Date of Birth: 09-10-1950 Admit Type: Outpatient Age: 70 Room: [facility] ENDO ROOM 2 Gender: Female Note Status: Finalized Procedure:             Upper GI endoscopy Indications:           Generalized abdominal pain, Esophageal dysphagia,                         Gastro-esophageal reflux disease, Nausea, Weight loss Providers:             Benay Pike. Shamarcus Hoheisel MD, MD Medicines:             Propofol per Anesthesia Complications:         No immediate complications. Procedure:             Pre-Anesthesia Assessment:                        - The risks and benefits of the procedure and the                         sedation options and risks were discussed with the                         patient. All questions were answered and informed                         consent was obtained.                        - Patient identification and proposed procedure were                         verified prior to the procedure by the nurse. The                         procedure was verified in the procedure room.                        - ASA Grade Assessment: III - A patient with severe                         systemic disease.                        - After reviewing the risks and benefits, the patient                         was deemed in satisfactory condition to undergo the                         procedure.                        After obtaining informed consent, the endoscope was                         passed under direct vision. Throughout the procedure,  the patient's blood pressure, pulse, and oxygen                         saturations were monitored continuously. The Endoscope                         was introduced through the mouth, and advanced to the                         third part of duodenum. The upper GI endoscopy was                          accomplished without difficulty. The patient tolerated                         the procedure well. Findings:      Diffuse mild mucosal variance characterized by altered texture was found       in the entire esophagus. Biopsies were obtained from the proximal and       distal esophagus with cold forceps for histology of suspected       eosinophilic esophagitis. Estimated blood loss was minimal.      There is no endoscopic evidence of stenosis, stricture, ulcerations or       mass in the entire esophagus.      A 1 cm hiatal hernia was present.      The examined duodenum was normal.      The exam was otherwise without abnormality. Impression:            - Esophageal mucosal variant. Biopsied.                        - 1 cm hiatal hernia.                        - Normal examined duodenum.                        - The examination was otherwise normal. Recommendation:        - Await pathology results.                        - Proceed with colonoscopy Procedure Code(s):     --- Professional ---                        [phone], Esophagogastroduodenoscopy, flexible,                         transoral; with biopsy, single or multiple Diagnosis Code(s):     --- Professional ---                        R63.4, Abnormal weight loss                        R11.0, Nausea                        K21.9, Gastro-esophageal reflux disease without                         esophagitis  R13.14, Dysphagia, pharyngoesophageal phase                        R10.84, Generalized abdominal pain                        K44.9, Diaphragmatic hernia without obstruction or                         gangrene                        K22.8, Other specified diseases of esophagus CPT copyright 2019 [employer]. All rights reserved. The codes documented in this report are preliminary and upon coder review may  be revised to meet current compliance requirements. Efrain Sella MD,  MD 01/03/2021 2:49:32 PM This report has been signed electronically. Number of Addenda: 0 Note Initiated On: 01/03/2021 2:41 PM Estimated Blood Loss:  Estimated blood loss: none.      [facility]

## 2021-01-03 NOTE — Anesthesia Preprocedure Evaluation (Signed)
Anesthesia Evaluation  Patient identified by MRN, date of birth, ID band Patient awake    Reviewed: Allergy & Precautions, H&P , NPO status , Patient's Chart, lab work & pertinent test results, reviewed documented beta blocker date and time   History of Anesthesia Complications Negative for: history of anesthetic complications  Airway Mallampati: II  TM Distance: >3 FB Neck ROM: full    Dental  (+) Dental Advidsory Given, Caps, Teeth Intact   Pulmonary neg shortness of breath, neg sleep apnea, neg COPD, neg recent URI, Current Smoker and Patient abstained from smoking.,    Pulmonary exam normal breath sounds clear to auscultation       Cardiovascular Exercise Tolerance: Good negative cardio ROS Normal cardiovascular exam Rhythm:regular Rate:Normal     Neuro/Psych negative neurological ROS  negative psych ROS   GI/Hepatic Neg liver ROS, GERD  ,  Endo/Other  negative endocrine ROS  Renal/GU negative Renal ROS  negative genitourinary   Musculoskeletal   Abdominal   Peds  Hematology negative hematology ROS (+)   Anesthesia Other Findings Past Medical History: No date: Allergy No date: Arthritis No date: GERD (gastroesophageal reflux disease) No date: Hyperlipemia No date: Vitamin D deficiency   Reproductive/Obstetrics negative OB ROS                             Anesthesia Physical Anesthesia Plan  ASA: II  Anesthesia Plan: General   Post-op Pain Management:    Induction: Intravenous  PONV Risk Score and Plan: 2 and TIVA and Propofol infusion  Airway Management Planned: Natural Airway and Nasal Cannula  Additional Equipment:   Intra-op Plan:   Post-operative Plan:   Informed Consent: I have reviewed the patients History and Physical, chart, labs and discussed the procedure including the risks, benefits and alternatives for the proposed anesthesia with the patient or  authorized representative who has indicated his/her understanding and acceptance.     Dental Advisory Given  Plan Discussed with: Anesthesiologist, CRNA and Surgeon  Anesthesia Plan Comments:         Anesthesia Quick Evaluation

## 2021-01-03 NOTE — Op Note (Signed)
[facility] Gastroenterology Patient Name: Katie Liu Procedure Date: 01/03/2021 2:41 PM MRN: [id number] Account #: [id number] Date of Birth: [birth year] Admit Type: Outpatient Age: 70 Room: [facility] ENDO ROOM 2 Gender: Female Note Status: Finalized Procedure:             Colonoscopy Indications:           Generalized abdominal pain, Clinically significant                         diarrhea of unexplained origin, Abnormal CT of the GI                         tract, Weight loss Providers:             Benay Pike. Sumit Branham MD, MD Medicines:             Propofol per Anesthesia Complications:         No immediate complications. Estimated blood loss:                         Minimal. Procedure:             Pre-Anesthesia Assessment:                        - The risks and benefits of the procedure and the                         sedation options and risks were discussed with the                         patient. All questions were answered and informed                         consent was obtained.                        - Patient identification and proposed procedure were                         verified prior to the procedure by the nurse. The                         procedure was verified in the procedure room.                        - ASA Grade Assessment: III - A patient with severe                         systemic disease.                        - After reviewing the risks and benefits, the patient                         was deemed in satisfactory condition to undergo the                         procedure.  After obtaining informed consent, the colonoscope was                         passed under direct vision. Throughout the procedure,                         the patient's blood pressure, pulse, and oxygen                         saturations were monitored continuously. The                         Colonoscope was introduced through the anus and                          advanced to the the cecum, identified by appendiceal                         orifice and ileocecal valve. The colonoscopy was                         somewhat difficult due to a partially obstructing                         mass. Successful completion of the procedure was aided                         by straightening and shortening the scope to obtain                         bowel loop reduction. The patient tolerated the                         procedure well. The quality of the bowel preparation                         was adequate. The ileocecal valve, appendiceal                         orifice, and rectum were photographed. Findings:      The perianal and digital rectal examinations were normal. Pertinent       negatives include normal sphincter tone and no palpable rectal lesions.      A 7 mm polyp was found in the rectum. The polyp was semi-pedunculated.       The polyp was removed with a hot snare. Resection and retrieval were       complete.      Non-bleeding internal hemorrhoids were found during retroflexion. The       hemorrhoids were Grade I (internal hemorrhoids that do not prolapse).      The cecum appeared normal.      A fungating, infiltrative and ulcerated partially obstructing large mass       was found in the proximal ascending colon. The mass was circumferential.       The mass measured four cm in length. No bleeding was present. Area       proximal to the lesion (base of cecum) was tattooed with an injection of       2 mL of Spot (  carbon black) and 70ml of spot was injected in 4 quadrants       3cm distal to the lesion.      Careful evaluation of the colon revealed no other sinister lesions or       polyps. Impression:            - One 7 mm polyp in the rectum, removed with a hot                         snare. Resected and retrieved.                        - Non-bleeding internal hemorrhoids.                        - The cecum is normal.                         - Malignant partially obstructing tumor in the                         proximal ascending colon. Tattooed.                        - Malignant-appearing tumor in the colon. Biopsied.                         Tattooed. Recommendation:        - Patient has a contact number available for                         emergencies. The signs and symptoms of potential                         delayed complications were discussed with the patient.                         Return to normal activities tomorrow. Written                         discharge instructions were provided to the patient.                        - Await pathology results from EGD, also performed                         today.                        - Resume previous diet.                        - Continue present medications.                        - Await pathology results.                        - Refer to a surgeon at appointment to be scheduled.                        - Refer  to an oncologist at appointment to be                         scheduled.                        - Repeat colonoscopy in 6 months for surveillance.                        - Return to GI office at appointment to be scheduled.                        - The findings and recommendations were discussed with                         the patient. Procedure Code(s):     --- Professional ---                        [phone], Colonoscopy, flexible; with removal of                         tumor(s), polyp(s), or other lesion(s) by snare                         technique                        [id number], Colonoscopy, flexible; with directed submucosal                         injection(s), any substance Diagnosis Code(s):     --- Professional ---                        R93.3, Abnormal findings on diagnostic imaging of                         other parts of digestive tract                        R63.4, Abnormal weight loss                        R19.7, Diarrhea,  unspecified                        R10.84, Generalized abdominal pain                        D49.0, Neoplasm of unspecified behavior of digestive                         system                        K56.690, Other partial intestinal obstruction                        C18.2, Malignant neoplasm of ascending colon                        K64.0, First degree hemorrhoids  K62.1, Rectal polyp CPT copyright 2019 American Medical Association. All rights reserved. The codes documented in this report are preliminary and upon coder review may  be revised to meet current compliance requirements. Efrain Sella MD, MD 01/03/2021 3:18:56 PM This report has been signed electronically. Number of Addenda: 0 Note Initiated On: 01/03/2021 2:41 PM Scope Withdrawal Time: 0 hours 11 minutes 9 seconds  Total Procedure Duration: 0 hours 15 minutes 47 seconds  Estimated Blood Loss:  Estimated blood loss was minimal.      [facility]

## 2021-01-03 NOTE — Anesthesia Postprocedure Evaluation (Signed)
Anesthesia Post Note  Patient: Katie Liu  Procedure(s) Performed: COLONOSCOPY WITH PROPOFOL (N/A ) ESOPHAGOGASTRODUODENOSCOPY (EGD) WITH PROPOFOL (N/A )  Patient location during evaluation: Endoscopy Anesthesia Type: General Level of consciousness: awake and alert Pain management: pain level controlled Vital Signs Assessment: post-procedure vital signs reviewed and stable Respiratory status: spontaneous breathing, nonlabored ventilation, respiratory function stable and patient connected to nasal cannula oxygen Cardiovascular status: blood pressure returned to baseline and stable Postop Assessment: no apparent nausea or vomiting Anesthetic complications: no   No complications documented.   Last Vitals:  Vitals:   01/03/21 1333 01/03/21 1511  BP: (!) 121/93 116/66  Pulse: 97   Resp: 17 20  Temp: 36.8 C 36.8 C  SpO2: 98%     Last Pain:  Vitals:   01/03/21 1521  TempSrc:   PainSc: 0-No pain                 Martha Clan

## 2021-01-03 NOTE — Interval H&P Note (Signed)
History and Physical Interval Note:  01/03/2021 2:39 PM  Katie Liu  has presented today for surgery, with the diagnosis of ABN CT SCAN ABD PAIN WT LOSS.  The various methods of treatment have been discussed with the patient and family. After consideration of risks, benefits and other options for treatment, the patient has consented to  Procedure(s): COLONOSCOPY WITH PROPOFOL (N/A) ESOPHAGOGASTRODUODENOSCOPY (EGD) WITH PROPOFOL (N/A) as a surgical intervention.  The patient's history has been reviewed, patient examined, no change in status, stable for surgery.  I have reviewed the patient's chart and labs.  Questions were answered to the patient's satisfaction.     [place], [place]

## 2021-01-05 LAB — SURGICAL PATHOLOGY

## 2021-01-11 ENCOUNTER — Inpatient Hospital Stay: Payer: Medicare HMO

## 2021-01-11 ENCOUNTER — Inpatient Hospital Stay: Payer: Medicare HMO | Attending: Oncology | Admitting: Oncology

## 2021-01-11 ENCOUNTER — Encounter: Payer: Self-pay | Admitting: Oncology

## 2021-01-11 VITALS — BP 134/83 | HR 92 | Temp 97.0°F | Resp 18 | Wt 137.3 lb

## 2021-01-11 DIAGNOSIS — D75839 Thrombocytosis, unspecified: Secondary | ICD-10-CM

## 2021-01-11 DIAGNOSIS — D72829 Elevated white blood cell count, unspecified: Secondary | ICD-10-CM | POA: Diagnosis not present

## 2021-01-11 DIAGNOSIS — Z79899 Other long term (current) drug therapy: Secondary | ICD-10-CM | POA: Insufficient documentation

## 2021-01-11 DIAGNOSIS — C182 Malignant neoplasm of ascending colon: Secondary | ICD-10-CM | POA: Diagnosis not present

## 2021-01-11 DIAGNOSIS — C189 Malignant neoplasm of colon, unspecified: Secondary | ICD-10-CM

## 2021-01-11 DIAGNOSIS — Z7189 Other specified counseling: Secondary | ICD-10-CM | POA: Insufficient documentation

## 2021-01-11 DIAGNOSIS — Z720 Tobacco use: Secondary | ICD-10-CM

## 2021-01-11 DIAGNOSIS — F17210 Nicotine dependence, cigarettes, uncomplicated: Secondary | ICD-10-CM | POA: Diagnosis not present

## 2021-01-11 LAB — CBC WITH DIFFERENTIAL/PLATELET
Abs Immature Granulocytes: 0.04 10*3/uL (ref 0.00–0.07)
Basophils Absolute: 0.1 10*3/uL (ref 0.0–0.1)
Basophils Relative: 1 %
Eosinophils Absolute: 0.3 10*3/uL (ref 0.0–0.5)
Eosinophils Relative: 3 %
HCT: 39.7 % (ref 36.0–46.0)
Hemoglobin: 12.9 g/dL (ref 12.0–15.0)
Immature Granulocytes: 0 %
Lymphocytes Relative: 31 %
Lymphs Abs: 3.8 10*3/uL (ref 0.7–4.0)
MCH: 28.9 pg (ref 26.0–34.0)
MCHC: 32.5 g/dL (ref 30.0–36.0)
MCV: 89 fL (ref 80.0–100.0)
Monocytes Absolute: 0.7 10*3/uL (ref 0.1–1.0)
Monocytes Relative: 6 %
Neutro Abs: 7.5 10*3/uL (ref 1.7–7.7)
Neutrophils Relative %: 59 %
Platelets: 417 10*3/uL — ABNORMAL HIGH (ref 150–400)
RBC: 4.46 MIL/uL (ref 3.87–5.11)
RDW: 13.4 % (ref 11.5–15.5)
WBC: 12.5 10*3/uL — ABNORMAL HIGH (ref 4.0–10.5)
nRBC: 0 % (ref 0.0–0.2)

## 2021-01-11 LAB — COMPREHENSIVE METABOLIC PANEL
ALT: 14 U/L (ref 0–44)
AST: 19 U/L (ref 15–41)
Albumin: 3.9 g/dL (ref 3.5–5.0)
Alkaline Phosphatase: 93 U/L (ref 38–126)
Anion gap: 13 (ref 5–15)
BUN: 8 mg/dL (ref 8–23)
CO2: 27 mmol/L (ref 22–32)
Calcium: 9.2 mg/dL (ref 8.9–10.3)
Chloride: 104 mmol/L (ref 98–111)
Creatinine, Ser: 0.72 mg/dL (ref 0.44–1.00)
GFR, Estimated: >60 mL/min (ref >60)
Glucose, Bld: 87 mg/dL (ref 70–99)
Potassium: 4.3 mmol/L (ref 3.5–5.1)
Sodium: 144 mmol/L (ref 135–145)
Total Bilirubin: 0.6 mg/dL (ref 0.3–1.2)
Total Protein: 7.7 g/dL (ref 6.5–8.1)

## 2021-01-11 NOTE — Progress Notes (Signed)
New patient evaluation.   

## 2021-01-11 NOTE — Progress Notes (Signed)
Hematology/Oncology Consult note [facility] [phone] [phone]   Patient Care Team: Adin Hector, MD as PCP - General (Internal Medicine) Clent Jacks, RN as Oncology Nurse Navigator  REFERRING PROVIDER: Efrain Sella, MD  CHIEF COMPLAINTS/REASON FOR VISIT:  Evaluation of right colon cancer  HISTORY OF PRESENTING ILLNESS:   Katie Liu is a  70 y.o.  female with PMH listed below was seen in consultation at the request of  Efrain Sella, MD  for evaluation of right colon cancer  12/21/2020, patient was referred to see [facility] gastroenterology Dr. Tina Griffiths for evaluation of generalized postprandial abdominal pain and unintentional weight loss.  Symptom onset is 2 to 3 months, some nausea without emesis.  20 pounds of unintentional weight loss within the past few months.  Early satiety, only eating one third of her plate. 12/30/2020, CT abdomen/pelvis angiogram was obtained for evaluation of postprandial abdominal pain. There was no significant narrowing of the mesenteric arteries or veins.  Masslike thickening of the wall of the cecum spacious for malignancy 01/03/2021, EGD showed esophageal mucosal.  1cm hiatal hernia Colonoscopy findings includes 7 mm polyp in the rectum, resected and retrieved, nonbleeding internal hemorrhoids. Malignant partially obstructing tumor in the proximal ascending colon.  Tattooed and biopsied.  Patient was referred to establish care with oncology for further evaluation and management. Is any family history of cancer. Current everyday smoker, 2 packs of cigarettes per week..  She denies any change of bowel habits, black or bloody stool. Patient is accompanied by her husband.  Review of Systems  Constitutional: Positive for unexpected weight change. Negative for appetite change, chills, fatigue and fever.  HENT:   Negative for hearing loss and voice change.   Eyes: Negative for  eye problems.  Respiratory: Negative for chest tightness and cough.   Cardiovascular: Negative for chest pain.  Gastrointestinal: Positive for abdominal pain. Negative for abdominal distention and blood in stool.  Endocrine: Negative for hot flashes.  Genitourinary: Negative for difficulty urinating and frequency.   Musculoskeletal: Negative for arthralgias.  Skin: Negative for itching and rash.  Neurological: Negative for extremity weakness.  Hematological: Negative for adenopathy.  Psychiatric/Behavioral: Negative for confusion.    MEDICAL HISTORY:  Past Medical History:  Diagnosis Date  . Allergy   . Arthritis   . GERD (gastroesophageal reflux disease)   . Hyperlipemia   . Vitamin D deficiency     SURGICAL HISTORY: Past Surgical History:  Procedure Laterality Date  . ACHILLES TENDON REPAIR    . CESAREAN SECTION    . COLONOSCOPY WITH PROPOFOL N/A 01/03/2021   Procedure: COLONOSCOPY WITH PROPOFOL;  Surgeon: Toledo, Benay Pike, MD;  Location: [facility] ENDOSCOPY;  Service: Gastroenterology;  Laterality: N/A;  . ESOPHAGOGASTRODUODENOSCOPY (EGD) WITH PROPOFOL N/A 01/03/2021   Procedure: ESOPHAGOGASTRODUODENOSCOPY (EGD) WITH PROPOFOL;  Surgeon: Toledo, Benay Pike, MD;  Location: [facility] ENDOSCOPY;  Service: Gastroenterology;  Laterality: N/A;  . KNEE ARTHROSCOPY W/ ACL RECONSTRUCTION      SOCIAL HISTORY: Social History   Socioeconomic History  . Marital status: Married    Spouse name: Not on file  . Number of children: Not on file  . Years of education: Not on file  . Highest education level: Not on file  Occupational History  . Not on file  Tobacco Use  . Smoking status: Current Every Day Smoker    Packs/day: 0.25  . Smokeless tobacco: Never Used  Substance and Sexual Activity  . Alcohol use: Yes  Alcohol/week: 2.0 standard drinks    Types: 2 Glasses of wine per week    Comment: occasional glass of wine.  . Drug use: Never  . Sexual activity: Not on file  Other Topics  Concern  . Not on file  Social History Narrative  . Not on file   Social Determinants of Health   Financial Resource Strain: Not on file  Food Insecurity: Not on file  Transportation Needs: Not on file  Physical Activity: Not on file  Stress: Not on file  Social Connections: Not on file  Intimate Partner Violence: Not on file    FAMILY HISTORY: Family History  Problem Relation Age of Onset  . Cancer Neg Hx     ALLERGIES:  has No Known Allergies.  MEDICATIONS:  Current Outpatient Medications  Medication Sig Dispense Refill  . ALPRAZolam (XANAX) 0.25 MG tablet Take 1 tablet (0.25 mg total) by mouth 2 (two) times daily as needed for anxiety. 10 tablet 0  . atorvastatin (LIPITOR) 40 MG tablet Take 40 mg by mouth daily.    Marland Kitchen CANNABIDIOL PO Take by mouth.    . cetirizine (ZYRTEC) 10 MG tablet Take 10 mg by mouth daily as needed for allergies.    . Cholecalciferol (VITAMIN D3) 5000 units CAPS Take 5,000 Units by mouth at bedtime.    . nicotine (NICODERM CQ - DOSED IN MG/24 HR) 7 mg/24hr patch Place 7 mg onto the skin daily.    . NON FORMULARY Place 5 drops under the tongue See admin instructions. CBD oil (contains NO "THC"): Place 5 drops under the tongue in the morning and 5 drops at bedtime    . omeprazole (PRILOSEC) 40 MG capsule Take 40 mg by mouth daily.    Marland Kitchen Propylene Glycol (SYSTANE BALANCE OP) Place 1-2 drops into both eyes 2 (two) times daily as needed (for dryness or irritation).    . sertraline (ZOLOFT) 25 MG tablet Take 25 mg by mouth daily.    Marland Kitchen amoxicillin-clavulanate (AUGMENTIN) 400-57 MG/5ML suspension Take by mouth 2 (two) times daily. (Patient not taking: No sig reported)    . aspirin 81 MG chewable tablet Chew 81 mg by mouth once as needed (for sudden onset of chest pain).  (Patient not taking: Reported on 01/11/2021)    . buPROPion (WELLBUTRIN SR) 150 MG 12 hr tablet Take 150 mg by mouth 2 (two) times daily. (Patient not taking: No sig reported)    . ibuprofen  (ADVIL,MOTRIN) 200 MG tablet Take 200 mg by mouth every 6 (six) hours as needed (for pain or headaches). (Patient not taking: Reported on 01/11/2021)    . Krill Oil 1000 MG CAPS Take 2,000 mg by mouth. (Patient not taking: Reported on 01/11/2021)    . Probiotic Product ([place]) Take by mouth. (Patient not taking: Reported on 01/11/2021)    . simvastatin (ZOCOR) 20 MG tablet Take 20 mg by mouth at bedtime. (Patient not taking: Reported on 01/11/2021)  4   No current facility-administered medications for this visit.     PHYSICAL EXAMINATION: ECOG PERFORMANCE STATUS: 1 - Symptomatic but completely ambulatory Vitals:   01/11/21 1116  BP: 134/83  Pulse: 92  Resp: 18  Temp: (!) 97 F (36.1 C)   Filed Weights   01/11/21 1116  Weight: 137 lb 4.8 oz (62.3 kg)    Physical Exam Constitutional:      General: She is not in acute distress. HENT:     Head: Normocephalic and atraumatic.  Eyes:  General: No scleral icterus. Cardiovascular:     Rate and Rhythm: Normal rate and regular rhythm.     Heart sounds: Normal heart sounds.  Pulmonary:     Effort: Pulmonary effort is normal. No respiratory distress.     Breath sounds: No wheezing.  Abdominal:     General: Bowel sounds are normal. There is no distension.     Palpations: Abdomen is soft.  Musculoskeletal:        General: No deformity. Normal range of motion.     Cervical back: Normal range of motion and neck supple.  Skin:    General: Skin is warm and dry.     Findings: No erythema or rash.  Neurological:     Mental Status: She is alert and oriented to person, place, and time. Mental status is at baseline.     Cranial Nerves: No cranial nerve deficit.     Coordination: Coordination normal.  Psychiatric:        Mood and Affect: Mood normal.     LABORATORY DATA:  I have reviewed the data as listed Lab Results  Component Value Date   WBC 12.5 (H) 01/11/2021   HGB 12.9 01/11/2021   HCT 39.7 01/11/2021    MCV 89.0 01/11/2021   PLT 417 (H) 01/11/2021   Recent Labs    01/11/21 1203  NA 144  K 4.3  CL 104  CO2 27  GLUCOSE 87  BUN 8  CREATININE 0.72  CALCIUM 9.2  GFRNONAA >60  PROT 7.7  ALBUMIN 3.9  AST 19  ALT 14  ALKPHOS 93  BILITOT 0.6   Iron/TIBC/Ferritin/ %Sat No results found for: IRON, TIBC, FERRITIN, IRONPCTSAT    RADIOGRAPHIC STUDIES: I have personally reviewed the radiological images as listed and agreed with the findings in the report. CT Angio Abd/Pel w/ and/or w/o  Result Date: 12/31/2020 CLINICAL DATA:  20 pound weight loss within 6 weeks Right mid and lower quadrant abdominal pain and tenderness EXAM: CTA ABDOMEN AND PELVIS WITHOUT AND WITH CONTRAST TECHNIQUE: Multidetector CT imaging of the abdomen and pelvis was performed using the standard protocol during bolus administration of intravenous contrast. Multiplanar reconstructed images and MIPs were obtained and reviewed to evaluate the vascular anatomy. CONTRAST:  172mL OMNIPAQUE IOHEXOL 300 MG/ML  SOLN COMPARISON:  None. FINDINGS: VASCULAR Aorta: Minimal scattered atheromatous plaque without flow-limiting stenosis or aneurysmal dilatation. Celiac: Mild stenosis at the origin. Otherwise patent without evidence of aneurysm, dissection, and vasculitis. SMA: Patent without evidence of aneurysm, dissection, vasculitis or significant stenosis. Renals: Both renal arteries are patent without evidence of aneurysm, dissection, vasculitis, fibromuscular dysplasia or significant stenosis. IMA: Patent. Inflow: Patent without evidence of aneurysm, dissection, vasculitis or significant stenosis. Proximal Outflow: Bilateral common femoral and visualized portions of the superficial and profunda femoral arteries are patent without evidence of aneurysm, dissection, vasculitis or significant stenosis. Veins: Hepatic, portal, splenic, superior mesenteric veins are patent. Review of the MIP images confirms the above findings. NON-VASCULAR Lower  chest: No acute abnormality. Hepatobiliary: No focal liver abnormality is seen. No gallstones, gallbladder wall thickening, or biliary dilatation. Pancreas: Unremarkable. No pancreatic ductal dilatation or surrounding inflammatory changes. Spleen: Normal in size without focal abnormality. Adrenals/Urinary Tract: Mild thickening of the left adrenal gland without discrete nodule. Adrenal glands are otherwise normal in appearance. Kidneys and ureters are normal. No significant abnormality of the bladder. Stomach/Bowel: No significant abnormality of stomach or small bowel. Masslike thickening of the wall of the cecum highly suspicious for malignancy. There is  mild adjacent fat stranding. No bowel dilatation to indicate ileus or obstruction. Lymphatic: No enlarged abdominal or pelvic lymph nodes. Reproductive: Uterus and bilateral adnexa are unremarkable. Other: No abdominal wall hernia or abnormality. No abdominopelvic ascites. Musculoskeletal: No acute osseous abnormality. Advanced degenerative changes seen at L5-S1 with severe right and moderate left neural foraminal stenosis secondary to facet spurring. IMPRESSION: VASCULAR No significant narrowing of mesenteric arteries or veins. NON-VASCULAR Masslike thickening of the wall of the cecum highly suspicious for malignancy. Further evaluation with colonoscopy should be performed. These results will be called to the ordering clinician or representative by the Radiologist Assistant, and communication documented in the PACS or [employer]. Electronically Signed   By: Miachel Roux M.D.   On: 12/31/2020 09:01      ASSESSMENT & PLAN:  1. Malignant neoplasm of ascending colon ([place])   2. Goals of care, counseling/discussion   3. Tobacco use   4. Leukocytosis, unspecified type   5. Thrombocytosis    #Ascending colon cancer Pathology results and image findings were cussed with patient. Pathology is consistent with moderately differentiated colon cancer No CT  radiographic evidence of liver metastasis Obtain CT chest Check CBC, CMP CEA. If no distant metastasis is detected, recommend patient to proceed with right hemicolectomy. Patient has an appointment with Dr. Dema Severin. Tobacco use smoke cessation was discussed with patient.  She is motivated. Mild leukocytosis and thrombocytosis, likely due to smoking.  Monitor for now.  If progressively worsening, will obtain leukocytosis/thrombocytosis work-up in the future.  Orders Placed This Encounter  Procedures  . CBC with Differential/Platelet    Standing Status:   Future    Number of Occurrences:   1    Standing Expiration Date:   01/11/2022  . Comprehensive metabolic panel    Standing Status:   Future    Number of Occurrences:   1    Standing Expiration Date:   01/11/2022  . CEA    Standing Status:   Future    Number of Occurrences:   1    Standing Expiration Date:   01/11/2022    All questions were answered. The patient knows to call the clinic with any problems questions or concerns.  cc Efrain Sella, MD    Return of visit: To be determined.  I will see patient 2 weeks after the surgery. Thank you for this kind referral and the opportunity to participate in the care of this patient. A copy of today's note is routed to referring provider    Earlie Server, MD, PhD Hematology Oncology [facility] Pager- [phone] 01/11/2021

## 2021-01-12 ENCOUNTER — Telehealth: Payer: Self-pay

## 2021-01-12 DIAGNOSIS — C182 Malignant neoplasm of ascending colon: Secondary | ICD-10-CM

## 2021-01-12 LAB — CEA: CEA: 5.3 ng/mL — ABNORMAL HIGH (ref 0.0–4.7)

## 2021-01-12 NOTE — Telephone Encounter (Signed)
Pt is calling and asking about her lab work she got them thru [facility] and she got some concerns about plts being high wants to discuss this

## 2021-01-12 NOTE — Telephone Encounter (Signed)
-----   Message from Earlie Server, MD sent at 01/11/2021  8:35 PM EDT ----- Please arrange patient to proceed with CT chest with contrast ASAP routine.  Reason is colon cancer staging. Patient is aware about the plan.

## 2021-01-12 NOTE — Telephone Encounter (Signed)
Please schedule as MD recommends and inform pt of appt details.

## 2021-01-20 DIAGNOSIS — C189 Malignant neoplasm of colon, unspecified: Secondary | ICD-10-CM | POA: Diagnosis not present

## 2021-01-24 ENCOUNTER — Other Ambulatory Visit: Payer: Self-pay

## 2021-01-24 ENCOUNTER — Ambulatory Visit
Admission: RE | Admit: 2021-01-24 | Discharge: 2021-01-24 | Disposition: A | Discharge status: Home / Self Care | Payer: Medicare HMO | Source: Ambulatory Visit | Attending: Oncology | Admitting: Oncology

## 2021-01-24 DIAGNOSIS — I2510 Atherosclerotic heart disease of native coronary artery without angina pectoris: Secondary | ICD-10-CM | POA: Diagnosis not present

## 2021-01-24 DIAGNOSIS — I700 Atherosclerosis of aorta: Secondary | ICD-10-CM | POA: Diagnosis not present

## 2021-01-24 DIAGNOSIS — C182 Malignant neoplasm of ascending colon: Secondary | ICD-10-CM

## 2021-01-24 DIAGNOSIS — C189 Malignant neoplasm of colon, unspecified: Secondary | ICD-10-CM | POA: Diagnosis not present

## 2021-01-24 MED ORDER — IOHEXOL 300 MG/ML  SOLN
75.0000 mL | Freq: Once | INTRAMUSCULAR | Status: AC | PRN
  Administered 2021-01-24: 75 mL via INTRAVENOUS

## 2021-02-03 ENCOUNTER — Telehealth: Payer: Self-pay

## 2021-02-03 NOTE — Telephone Encounter (Signed)
Per previous result note from Dr. Tasia Catchings: My chart message sent. Looks like surgery is at the end of June. Please arrangeher to see me in [facility] July to go over results.

## 2021-02-03 NOTE — Telephone Encounter (Signed)
-----   Message from Earlie Server, MD sent at 02/02/2021  9:31 PM EDT ----- Please forward result to PCP

## 2021-02-11 ENCOUNTER — Other Ambulatory Visit (HOSPITAL_COMMUNITY): Payer: Self-pay

## 2021-02-14 NOTE — Patient Instructions (Addendum)
DUE TO COVID-19 ONLY ONE VISITOR IS ALLOWED TO COME WITH YOU AND STAY IN THE WAITING ROOM ONLY DURING PRE OP AND PROCEDURE DAY OF SURGERY. THE 1 VISITOR  MAY VISIT WITH YOU AFTER SURGERY IN YOUR PRIVATE ROOM DURING VISITING HOURS ONLY!  YOU NEED TO HAVE A COVID 19 TEST ON: 02/22/21 @ 10:00 AM ,THIS TEST MUST BE DONE BEFORE SURGERY,  COVID TESTING SITE [place] [address], IT IS ON THE RIGHT GOING OUT [address] APPROXIMATELY  2 MINUTES PAST [employer] ON THE RIGHT. ONCE YOUR COVID TEST IS COMPLETED,  PLEASE BEGIN THE QUARANTINE INSTRUCTIONS AS OUTLINED IN YOUR HANDOUT.                Katie Liu   Your procedure is scheduled on: 02/24/21   Report to [facility] Main  Entrance   Report to admitting at : 1:00 PM     Call this number if you have problems the morning of surgery [phone]    Remember: Do not eat solid food :After Midnight. Clear liquids until: 12:00 PM.  CLEAR LIQUID DIET  Foods Allowed                                                                     Foods Excluded  Coffee and tea, regular and decaf                             liquids that you cannot  Plain Jell-O any favor except red or purple                                           see through such as: Fruit ices (not with fruit pulp)                                     milk, soups, orange juice  Iced Popsicles                                    All solid food Carbonated beverages, regular and diet                                    Cranberry, grape and apple juices Sports drinks like Gatorade Lightly seasoned clear broth or consume(fat free) Sugar, honey syrup  Sample Menu Breakfast                                Lunch                                     Supper Cranberry juice  Beef broth                            Chicken broth Jell-O                                     Grape juice                           Apple juice Coffee or tea                         Jell-O                                      Popsicle                                                Coffee or tea                        Coffee or tea  _____________________________________________________________________   BRUSH YOUR TEETH MORNING OF SURGERY AND RINSE YOUR MOUTH OUT, NO CHEWING GUM CANDY OR MINTS.    Take these medicines the morning of surgery with A SIP OF WATER: sertraline,cetirizine,omeprazole.                               You may not have any metal on your body including hair pins and              piercings  Do not wear jewelry, make-up, lotions, powders or perfumes, deodorant             Do not wear nail polish on your fingernails.  Do not shave  48 hours prior to surgery.    Do not bring valuables to the hospital. [place].  Contacts, dentures or bridgework may not be worn into surgery.  Leave suitcase in the car. After surgery it may be brought to your room.     Patients discharged the day of surgery will not be allowed to drive home. IF YOU ARE HAVING SURGERY AND GOING HOME THE SAME DAY, YOU MUST HAVE AN ADULT TO DRIVE YOU HOME AND BE WITH YOU FOR 24 HOURS. YOU MAY GO HOME BY TAXI OR UBER OR ORTHERWISE, BUT AN ADULT MUST ACCOMPANY YOU HOME AND STAY WITH YOU FOR 24 HOURS.  Name and phone number of your driver:  Special Instructions: N/A              Please read over the following fact sheets you were given: _____________________________________________________________________           [facility] - Preparing for Surgery Before surgery, you can play an important role.  Because skin is not sterile, your skin needs to be as free of germs as possible.  You can reduce the number of germs on your skin by washing with CHG (chlorahexidine gluconate) soap before surgery.  CHG is  an antiseptic cleaner which kills germs and bonds with the skin to continue killing germs even after washing. Please DO NOT use if  you have an allergy to CHG or antibacterial soaps.  If your skin becomes reddened/irritated stop using the CHG and inform your nurse when you arrive at Short Stay. Do not shave (including legs and underarms) for at least 48 hours prior to the first CHG shower.  You may shave your face/neck. Please follow these instructions carefully:  1.  Shower with CHG Soap the night before surgery and the  morning of Surgery.  2.  If you choose to wash your hair, wash your hair first as usual with your  normal  shampoo.  3.  After you shampoo, rinse your hair and body thoroughly to remove the  shampoo.                           4.  Use CHG as you would any other liquid soap.  You can apply chg directly  to the skin and wash                       Gently with a scrungie or clean washcloth.  5.  Apply the CHG Soap to your body ONLY FROM THE NECK DOWN.   Do not use on face/ open                           Wound or open sores. Avoid contact with eyes, ears mouth and genitals (private parts).                       Wash face,  Genitals (private parts) with your normal soap.             6.  Wash thoroughly, paying special attention to the area where your surgery  will be performed.  7.  Thoroughly rinse your body with warm water from the neck down.  8.  DO NOT shower/wash with your normal soap after using and rinsing off  the CHG Soap.                9.  Pat yourself dry with a clean towel.            10.  Wear clean pajamas.            11.  Place clean sheets on your bed the night of your first shower and do not  sleep with pets. Day of Surgery : Do not apply any lotions/deodorants the morning of surgery.  Please wear clean clothes to the hospital/surgery center.  FAILURE TO FOLLOW THESE INSTRUCTIONS MAY RESULT IN THE CANCELLATION OF YOUR SURGERY PATIENT SIGNATURE_________________________________  NURSE  SIGNATURE__________________________________  ________________________________________________________________________

## 2021-02-14 NOTE — Progress Notes (Signed)
Pt. Needs orders for upcoming surgery.PAT and labs on 02/15/21.

## 2021-02-15 ENCOUNTER — Encounter (HOSPITAL_COMMUNITY)
Admission: RE | Admit: 2021-02-15 | Discharge: 2021-02-15 | Disposition: A | Discharge status: Home / Self Care | Payer: Medicare HMO | Source: Ambulatory Visit | Attending: Surgery | Admitting: Surgery

## 2021-02-15 ENCOUNTER — Other Ambulatory Visit: Payer: Self-pay

## 2021-02-15 ENCOUNTER — Encounter (HOSPITAL_COMMUNITY): Payer: Self-pay

## 2021-02-15 DIAGNOSIS — Z01812 Encounter for preprocedural laboratory examination: Secondary | ICD-10-CM | POA: Insufficient documentation

## 2021-02-15 LAB — CBC
HCT: 35.2 % — ABNORMAL LOW (ref 36.0–46.0)
Hemoglobin: 11 g/dL — ABNORMAL LOW (ref 12.0–15.0)
MCH: 28.3 pg (ref 26.0–34.0)
MCHC: 31.3 g/dL (ref 30.0–36.0)
MCV: 90.5 fL (ref 80.0–100.0)
Platelets: 402 10*3/uL — ABNORMAL HIGH (ref 150–400)
RBC: 3.89 MIL/uL (ref 3.87–5.11)
RDW: 14.4 % (ref 11.5–15.5)
WBC: 10.3 10*3/uL (ref 4.0–10.5)
nRBC: 0 % (ref 0.0–0.2)

## 2021-02-15 NOTE — Progress Notes (Addendum)
COVID Vaccine Completed: Yes Date COVID Vaccine completed: 01/13/20 COVID vaccine manufacturer: Pfizer      PCP - Dr. Ramonita Lab Cardiologist -   Chest x-ray -  EKG -  Stress Test -  ECHO -  Cardiac Cath -  Pacemaker/ICD device last checked:  Sleep Study -  CPAP -   Fasting Blood Sugar -  Checks Blood Sugar _____ times a day  Blood Thinner Instructions: Aspirin Instructions: Last Dose:  Anesthesia review:   Patient denies shortness of breath, fever, cough and chest pain at PAT appointment   Patient verbalized understanding of instructions that were given to them at the PAT appointment. Patient was also instructed that they will need to review over the PAT instructions again at home before surgery.

## 2021-02-22 ENCOUNTER — Other Ambulatory Visit (HOSPITAL_COMMUNITY)
Admission: RE | Admit: 2021-02-22 | Discharge: 2021-02-22 | Disposition: A | Discharge status: Home / Self Care | Payer: Medicare HMO | Source: Ambulatory Visit | Attending: Surgery | Admitting: Surgery

## 2021-02-22 DIAGNOSIS — R238 Other skin changes: Secondary | ICD-10-CM | POA: Diagnosis not present

## 2021-02-22 DIAGNOSIS — C180 Malignant neoplasm of cecum: Secondary | ICD-10-CM | POA: Diagnosis present

## 2021-02-22 DIAGNOSIS — E559 Vitamin D deficiency, unspecified: Secondary | ICD-10-CM | POA: Diagnosis not present

## 2021-02-22 DIAGNOSIS — I10 Essential (primary) hypertension: Secondary | ICD-10-CM | POA: Diagnosis present

## 2021-02-22 DIAGNOSIS — Z87891 Personal history of nicotine dependence: Secondary | ICD-10-CM | POA: Diagnosis not present

## 2021-02-22 DIAGNOSIS — Z20822 Contact with and (suspected) exposure to covid-19: Secondary | ICD-10-CM | POA: Diagnosis present

## 2021-02-22 DIAGNOSIS — C182 Malignant neoplasm of ascending colon: Secondary | ICD-10-CM | POA: Diagnosis not present

## 2021-02-22 DIAGNOSIS — Z01812 Encounter for preprocedural laboratory examination: Secondary | ICD-10-CM | POA: Insufficient documentation

## 2021-02-22 DIAGNOSIS — E785 Hyperlipidemia, unspecified: Secondary | ICD-10-CM | POA: Diagnosis present

## 2021-02-22 DIAGNOSIS — K219 Gastro-esophageal reflux disease without esophagitis: Secondary | ICD-10-CM | POA: Diagnosis present

## 2021-02-22 DIAGNOSIS — Z86010 Personal history of colonic polyps: Secondary | ICD-10-CM | POA: Diagnosis not present

## 2021-02-23 LAB — SARS CORONAVIRUS 2 (TAT 6-24 HRS): SARS Coronavirus 2: NEGATIVE

## 2021-02-24 ENCOUNTER — Other Ambulatory Visit: Payer: Self-pay

## 2021-02-24 ENCOUNTER — Inpatient Hospital Stay (HOSPITAL_COMMUNITY): Payer: Medicare HMO | Admitting: Certified Registered"

## 2021-02-24 ENCOUNTER — Encounter (HOSPITAL_COMMUNITY): Payer: Self-pay | Admitting: Surgery

## 2021-02-24 ENCOUNTER — Inpatient Hospital Stay (HOSPITAL_COMMUNITY)
Admission: RE | Admit: 2021-02-24 | Discharge: 2021-02-26 | DRG: 331 | Disposition: A | Discharge status: Home / Self Care | Payer: Medicare HMO | Source: Ambulatory Visit | Attending: Surgery | Admitting: Surgery

## 2021-02-24 ENCOUNTER — Encounter (HOSPITAL_COMMUNITY)
Admission: RE | Disposition: A | Discharge status: Home / Self Care | Payer: Self-pay | Source: Ambulatory Visit | Attending: Surgery

## 2021-02-24 DIAGNOSIS — C189 Malignant neoplasm of colon, unspecified: Secondary | ICD-10-CM | POA: Diagnosis present

## 2021-02-24 DIAGNOSIS — I10 Essential (primary) hypertension: Secondary | ICD-10-CM | POA: Diagnosis present

## 2021-02-24 DIAGNOSIS — Z20822 Contact with and (suspected) exposure to covid-19: Secondary | ICD-10-CM | POA: Diagnosis present

## 2021-02-24 DIAGNOSIS — K219 Gastro-esophageal reflux disease without esophagitis: Secondary | ICD-10-CM | POA: Diagnosis present

## 2021-02-24 DIAGNOSIS — E785 Hyperlipidemia, unspecified: Secondary | ICD-10-CM | POA: Diagnosis present

## 2021-02-24 DIAGNOSIS — Z86010 Personal history of colonic polyps: Secondary | ICD-10-CM

## 2021-02-24 DIAGNOSIS — Z87891 Personal history of nicotine dependence: Secondary | ICD-10-CM | POA: Diagnosis not present

## 2021-02-24 DIAGNOSIS — C180 Malignant neoplasm of cecum: Principal | ICD-10-CM | POA: Diagnosis present

## 2021-02-24 HISTORY — PX: LAPAROSCOPIC RIGHT HEMI COLECTOMY: SHX5926

## 2021-02-24 LAB — TYPE AND SCREEN
ABO/RH(D): A POS
Antibody Screen: NEGATIVE

## 2021-02-24 LAB — HEMOGLOBIN A1C
Hgb A1c MFr Bld: 5.7 % — ABNORMAL HIGH (ref 4.8–5.6)
Mean Plasma Glucose: 116.89 mg/dL

## 2021-02-24 LAB — ABO/RH: ABO/RH(D): A POS

## 2021-02-24 SURGERY — LAPAROSCOPIC RIGHT HEMI COLECTOMY
Anesthesia: General

## 2021-02-24 MED ORDER — HYDROMORPHONE HCL 1 MG/ML IJ SOLN
0.5000 mg | INTRAMUSCULAR | Status: DC | PRN
  Administered 2021-02-25: 0.5 mg via INTRAVENOUS
  Filled 2021-02-24: qty 0.5

## 2021-02-24 MED ORDER — VITAMIN D3 25 MCG (1000 UNIT) PO TABS
2000.0000 [IU] | ORAL_TABLET | Freq: Every day | ORAL | Status: DC
  Administered 2021-02-24 – 2021-02-25 (×2): 2000 [IU] via ORAL
  Filled 2021-02-24 (×2): qty 2

## 2021-02-24 MED ORDER — TRAMADOL HCL 50 MG PO TABS
50.0000 mg | ORAL_TABLET | Freq: Four times a day (QID) | ORAL | Status: DC | PRN
  Administered 2021-02-24 – 2021-02-26 (×4): 50 mg via ORAL
  Filled 2021-02-24 (×4): qty 1

## 2021-02-24 MED ORDER — BUPIVACAINE LIPOSOME 1.3 % IJ SUSP
20.0000 mL | Freq: Once | INTRAMUSCULAR | Status: DC
  Filled 2021-02-24: qty 20

## 2021-02-24 MED ORDER — CHLORHEXIDINE GLUCONATE 0.12 % MT SOLN
15.0000 mL | Freq: Once | OROMUCOSAL | Status: AC
  Administered 2021-02-24: 15 mL via OROMUCOSAL

## 2021-02-24 MED ORDER — LIDOCAINE 2% (20 MG/ML) 5 ML SYRINGE
INTRAMUSCULAR | Status: DC | PRN
  Administered 2021-02-24: 20 mg via INTRAVENOUS

## 2021-02-24 MED ORDER — KETAMINE HCL 10 MG/ML IJ SOLN
INTRAMUSCULAR | Status: AC
  Filled 2021-02-24: qty 1

## 2021-02-24 MED ORDER — HYDROMORPHONE HCL 2 MG/ML IJ SOLN
INTRAMUSCULAR | Status: AC
  Filled 2021-02-24: qty 1

## 2021-02-24 MED ORDER — BUPIVACAINE-EPINEPHRINE (PF) 0.25% -1:200000 IJ SOLN
INTRAMUSCULAR | Status: AC
  Filled 2021-02-24: qty 30

## 2021-02-24 MED ORDER — SODIUM CHLORIDE 0.9 % IV SOLN
2.0000 g | INTRAVENOUS | Status: AC
  Administered 2021-02-24: 2 g via INTRAVENOUS
  Filled 2021-02-24: qty 2

## 2021-02-24 MED ORDER — LORATADINE 10 MG PO TABS
10.0000 mg | ORAL_TABLET | Freq: Every day | ORAL | Status: DC
  Administered 2021-02-26: 10 mg via ORAL
  Filled 2021-02-24 (×2): qty 1

## 2021-02-24 MED ORDER — SCOPOLAMINE 1 MG/3DAYS TD PT72
1.0000 | MEDICATED_PATCH | TRANSDERMAL | Status: DC
  Administered 2021-02-24: 1.5 mg via TRANSDERMAL
  Filled 2021-02-24: qty 1

## 2021-02-24 MED ORDER — ALVIMOPAN 12 MG PO CAPS
12.0000 mg | ORAL_CAPSULE | ORAL | Status: AC
  Administered 2021-02-24: 12 mg via ORAL
  Filled 2021-02-24: qty 1

## 2021-02-24 MED ORDER — ATORVASTATIN CALCIUM 40 MG PO TABS
40.0000 mg | ORAL_TABLET | Freq: Every day | ORAL | Status: DC
  Administered 2021-02-25 – 2021-02-26 (×2): 40 mg via ORAL
  Filled 2021-02-24 (×2): qty 1

## 2021-02-24 MED ORDER — ALVIMOPAN 12 MG PO CAPS
12.0000 mg | ORAL_CAPSULE | Freq: Two times a day (BID) | ORAL | Status: DC
  Administered 2021-02-25: 12 mg via ORAL
  Filled 2021-02-24: qty 1

## 2021-02-24 MED ORDER — ROCURONIUM BROMIDE 10 MG/ML (PF) SYRINGE
PREFILLED_SYRINGE | INTRAVENOUS | Status: DC | PRN
  Administered 2021-02-24: 60 mg via INTRAVENOUS
  Administered 2021-02-24: 10 mg via INTRAVENOUS

## 2021-02-24 MED ORDER — PROMETHAZINE HCL 25 MG/ML IJ SOLN: 6.2500 mg | INTRAMUSCULAR | Status: DC | PRN

## 2021-02-24 MED ORDER — EPHEDRINE 5 MG/ML INJ
INTRAVENOUS | Status: AC
  Filled 2021-02-24: qty 10

## 2021-02-24 MED ORDER — PROPOFOL 10 MG/ML IV BOLUS
INTRAVENOUS | Status: DC | PRN
  Administered 2021-02-24: 120 mg via INTRAVENOUS
  Administered 2021-02-24: 30 mg via INTRAVENOUS

## 2021-02-24 MED ORDER — HYDRALAZINE HCL 20 MG/ML IJ SOLN: 10.0000 mg | INTRAMUSCULAR | Status: DC | PRN

## 2021-02-24 MED ORDER — BUPIVACAINE LIPOSOME 1.3 % IJ SUSP
INTRAMUSCULAR | Status: DC | PRN
  Administered 2021-02-24: 20 mL

## 2021-02-24 MED ORDER — CHLORHEXIDINE GLUCONATE CLOTH 2 % EX PADS: 6.0000 | MEDICATED_PAD | Freq: Once | CUTANEOUS | Status: DC

## 2021-02-24 MED ORDER — SUGAMMADEX SODIUM 200 MG/2ML IV SOLN
INTRAVENOUS | Status: DC | PRN
  Administered 2021-02-24: 150 mg via INTRAVENOUS

## 2021-02-24 MED ORDER — ACETAMINOPHEN 500 MG PO TABS
1000.0000 mg | ORAL_TABLET | ORAL | Status: AC
  Administered 2021-02-24: 1000 mg via ORAL
  Filled 2021-02-24: qty 2

## 2021-02-24 MED ORDER — FENTANYL CITRATE (PF) 250 MCG/5ML IJ SOLN
INTRAMUSCULAR | Status: DC | PRN
  Administered 2021-02-24: 50 ug via INTRAVENOUS
  Administered 2021-02-24: 150 ug via INTRAVENOUS
  Administered 2021-02-24 (×3): 50 ug via INTRAVENOUS

## 2021-02-24 MED ORDER — EPHEDRINE SULFATE-NACL 50-0.9 MG/10ML-% IV SOSY
PREFILLED_SYRINGE | INTRAVENOUS | Status: DC | PRN
  Administered 2021-02-24: 10 mg via INTRAVENOUS

## 2021-02-24 MED ORDER — LACTATED RINGERS IR SOLN
Status: DC | PRN
  Administered 2021-02-24: 1000 mL

## 2021-02-24 MED ORDER — LACTATED RINGERS IV SOLN: INTRAVENOUS | Status: DC

## 2021-02-24 MED ORDER — SIMETHICONE 80 MG PO CHEW
40.0000 mg | CHEWABLE_TABLET | Freq: Four times a day (QID) | ORAL | Status: DC | PRN
  Administered 2021-02-25: 40 mg via ORAL
  Filled 2021-02-24: qty 1

## 2021-02-24 MED ORDER — ACETAMINOPHEN 500 MG PO TABS
1000.0000 mg | ORAL_TABLET | Freq: Four times a day (QID) | ORAL | Status: DC
  Administered 2021-02-24 – 2021-02-26 (×5): 1000 mg via ORAL
  Filled 2021-02-24 (×5): qty 2

## 2021-02-24 MED ORDER — ONDANSETRON HCL 4 MG PO TABS: 4.0000 mg | ORAL_TABLET | Freq: Four times a day (QID) | ORAL | Status: DC | PRN

## 2021-02-24 MED ORDER — SODIUM CHLORIDE (PF) 0.9 % IJ SOLN
INTRAMUSCULAR | Status: AC
  Filled 2021-02-24: qty 50

## 2021-02-24 MED ORDER — IBUPROFEN 400 MG PO TABS
600.0000 mg | ORAL_TABLET | Freq: Four times a day (QID) | ORAL | Status: DC | PRN
  Administered 2021-02-24 – 2021-02-26 (×3): 600 mg via ORAL
  Filled 2021-02-24 (×3): qty 1

## 2021-02-24 MED ORDER — LIDOCAINE HCL 2 % IJ SOLN
INTRAMUSCULAR | Status: AC
  Filled 2021-02-24: qty 20

## 2021-02-24 MED ORDER — ALUM & MAG HYDROXIDE-SIMETH 200-200-20 MG/5ML PO SUSP: 30.0000 mL | Freq: Four times a day (QID) | ORAL | Status: DC | PRN

## 2021-02-24 MED ORDER — MIDAZOLAM HCL 2 MG/2ML IJ SOLN
0.5000 mg | Freq: Once | INTRAMUSCULAR | Status: DC | PRN
Start: 2021-02-24 — End: 2021-02-24

## 2021-02-24 MED ORDER — HEPARIN SODIUM (PORCINE) 5000 UNIT/ML IJ SOLN
5000.0000 [IU] | Freq: Three times a day (TID) | INTRAMUSCULAR | Status: DC
  Administered 2021-02-25 – 2021-02-26 (×4): 5000 [IU] via SUBCUTANEOUS
  Filled 2021-02-24 (×4): qty 1

## 2021-02-24 MED ORDER — PHENYLEPHRINE 40 MCG/ML (10ML) SYRINGE FOR IV PUSH (FOR BLOOD PRESSURE SUPPORT)
PREFILLED_SYRINGE | INTRAVENOUS | Status: AC
  Filled 2021-02-24: qty 10

## 2021-02-24 MED ORDER — LIDOCAINE 2% (20 MG/ML) 5 ML SYRINGE
INTRAMUSCULAR | Status: AC
  Filled 2021-02-24: qty 5

## 2021-02-24 MED ORDER — PROPOFOL 10 MG/ML IV BOLUS
INTRAVENOUS | Status: AC
  Filled 2021-02-24: qty 20

## 2021-02-24 MED ORDER — FENTANYL CITRATE (PF) 250 MCG/5ML IJ SOLN
INTRAMUSCULAR | Status: AC
  Filled 2021-02-24: qty 5

## 2021-02-24 MED ORDER — ENSURE SURGERY PO LIQD
237.0000 mL | Freq: Two times a day (BID) | ORAL | Status: DC
  Administered 2021-02-25 – 2021-02-26 (×3): 237 mL via ORAL

## 2021-02-24 MED ORDER — HEPARIN SODIUM (PORCINE) 5000 UNIT/ML IJ SOLN
5000.0000 [IU] | Freq: Once | INTRAMUSCULAR | Status: AC
  Administered 2021-02-24: 5000 [IU] via SUBCUTANEOUS
  Filled 2021-02-24: qty 1

## 2021-02-24 MED ORDER — ORAL CARE MOUTH RINSE: 15.0000 mL | Freq: Once | OROMUCOSAL | Status: AC

## 2021-02-24 MED ORDER — MIDAZOLAM HCL 2 MG/2ML IJ SOLN
INTRAMUSCULAR | Status: AC
  Filled 2021-02-24: qty 2

## 2021-02-24 MED ORDER — ONDANSETRON HCL 4 MG/2ML IJ SOLN: 4.0000 mg | Freq: Four times a day (QID) | INTRAMUSCULAR | Status: DC | PRN

## 2021-02-24 MED ORDER — MIDAZOLAM HCL 2 MG/2ML IJ SOLN
INTRAMUSCULAR | Status: DC | PRN
  Administered 2021-02-24: 2 mg via INTRAVENOUS

## 2021-02-24 MED ORDER — ONDANSETRON HCL 4 MG/2ML IJ SOLN
INTRAMUSCULAR | Status: DC | PRN
  Administered 2021-02-24: 4 mg via INTRAVENOUS

## 2021-02-24 MED ORDER — ONDANSETRON HCL 4 MG/2ML IJ SOLN
INTRAMUSCULAR | Status: AC
  Filled 2021-02-24: qty 2

## 2021-02-24 MED ORDER — OXYCODONE HCL 5 MG PO TABS: 5.0000 mg | ORAL_TABLET | Freq: Once | ORAL | Status: DC | PRN

## 2021-02-24 MED ORDER — ACETAMINOPHEN 500 MG PO TABS: 1000.0000 mg | ORAL_TABLET | Freq: Once | ORAL | Status: DC

## 2021-02-24 MED ORDER — ROCURONIUM BROMIDE 10 MG/ML (PF) SYRINGE
PREFILLED_SYRINGE | INTRAVENOUS | Status: AC
  Filled 2021-02-24: qty 10

## 2021-02-24 MED ORDER — DEXAMETHASONE SODIUM PHOSPHATE 10 MG/ML IJ SOLN
INTRAMUSCULAR | Status: DC | PRN
  Administered 2021-02-24: 6 mg via INTRAVENOUS

## 2021-02-24 MED ORDER — OXYCODONE HCL 5 MG/5ML PO SOLN
5.0000 mg | Freq: Once | ORAL | Status: DC | PRN
Start: 2021-02-24 — End: 2021-02-24

## 2021-02-24 MED ORDER — SODIUM CHLORIDE 0.9 % IR SOLN
Status: DC | PRN
  Administered 2021-02-24: 2000 mL

## 2021-02-24 MED ORDER — ENSURE PRE-SURGERY PO LIQD: 592.0000 mL | Freq: Once | ORAL | Status: DC

## 2021-02-24 MED ORDER — BUPIVACAINE-EPINEPHRINE 0.25% -1:200000 IJ SOLN
INTRAMUSCULAR | Status: DC | PRN
  Administered 2021-02-24: 30 mL

## 2021-02-24 MED ORDER — DIPHENHYDRAMINE HCL 12.5 MG/5ML PO ELIX
12.5000 mg | ORAL_SOLUTION | Freq: Four times a day (QID) | ORAL | Status: DC | PRN
  Administered 2021-02-25: 12.5 mg via ORAL
  Filled 2021-02-24: qty 5

## 2021-02-24 MED ORDER — KETAMINE HCL 10 MG/ML IJ SOLN
INTRAMUSCULAR | Status: DC | PRN
  Administered 2021-02-24: 20 mg via INTRAVENOUS
  Administered 2021-02-24: 10 mg via INTRAVENOUS

## 2021-02-24 MED ORDER — HYDROMORPHONE HCL 1 MG/ML IJ SOLN: 0.2500 mg | INTRAMUSCULAR | Status: DC | PRN

## 2021-02-24 MED ORDER — PHENYLEPHRINE 40 MCG/ML (10ML) SYRINGE FOR IV PUSH (FOR BLOOD PRESSURE SUPPORT)
PREFILLED_SYRINGE | INTRAVENOUS | Status: DC | PRN
  Administered 2021-02-24 (×2): 120 ug via INTRAVENOUS

## 2021-02-24 MED ORDER — DIPHENHYDRAMINE HCL 50 MG/ML IJ SOLN: 12.5000 mg | Freq: Four times a day (QID) | INTRAMUSCULAR | Status: DC | PRN

## 2021-02-24 MED ORDER — HYDROMORPHONE HCL 1 MG/ML IJ SOLN
INTRAMUSCULAR | Status: DC | PRN
  Administered 2021-02-24: 1 mg via INTRAVENOUS

## 2021-02-24 MED ORDER — ENSURE PRE-SURGERY PO LIQD
296.0000 mL | Freq: Once | ORAL | Status: DC
  Filled 2021-02-24: qty 296

## 2021-02-24 MED ORDER — PANTOPRAZOLE SODIUM 40 MG PO TBEC
40.0000 mg | DELAYED_RELEASE_TABLET | Freq: Every day | ORAL | Status: DC
  Administered 2021-02-24 – 2021-02-26 (×3): 40 mg via ORAL
  Filled 2021-02-24 (×3): qty 1

## 2021-02-24 MED ORDER — DEXAMETHASONE SODIUM PHOSPHATE 10 MG/ML IJ SOLN
INTRAMUSCULAR | Status: AC
  Filled 2021-02-24: qty 1

## 2021-02-24 MED ORDER — SERTRALINE HCL 25 MG PO TABS
25.0000 mg | ORAL_TABLET | Freq: Every day | ORAL | Status: DC
  Administered 2021-02-25 – 2021-02-26 (×2): 25 mg via ORAL
  Filled 2021-02-24 (×2): qty 1

## 2021-02-24 MED ORDER — MEPERIDINE HCL 50 MG/ML IJ SOLN: 6.2500 mg | INTRAMUSCULAR | Status: DC | PRN

## 2021-02-24 MED ORDER — LIDOCAINE 2% (20 MG/ML) 5 ML SYRINGE
INTRAMUSCULAR | Status: DC | PRN
  Administered 2021-02-24: 1.5 mg/kg/h via INTRAVENOUS

## 2021-02-24 SURGICAL SUPPLY — 71 items
ADH SKN CLS APL DERMABOND .7 (GAUZE/BANDAGES/DRESSINGS) ×2
APL PRP STRL LF DISP 70% ISPRP (MISCELLANEOUS)
APPLIER CLIP ROT 10 11.4 M/L (STAPLE)
APR CLP MED LRG 11.4X10 (STAPLE)
BAG COUNTER SPONGE SURGICOUNT (BAG) IMPLANT
BAG SPNG CNTER NS LX DISP (BAG)
BLADE CLIPPER SURG (BLADE) IMPLANT
CABLE HIGH FREQUENCY MONO STRZ (ELECTRODE) ×2 IMPLANT
CELLS DAT CNTRL 66122 CELL SVR (MISCELLANEOUS) IMPLANT
CHLORAPREP W/TINT 26 (MISCELLANEOUS) ×1 IMPLANT
CLIP APPLIE ROT 10 11.4 M/L (STAPLE) IMPLANT
COVER SURGICAL LIGHT HANDLE (MISCELLANEOUS) ×1 IMPLANT
DECANTER SPIKE VIAL GLASS SM (MISCELLANEOUS) ×1 IMPLANT
DERMABOND ADVANCED (GAUZE/BANDAGES/DRESSINGS) ×2
DERMABOND ADVANCED .7 DNX12 (GAUZE/BANDAGES/DRESSINGS) IMPLANT
DISSECTOR BLUNT TIP ENDO 5MM (MISCELLANEOUS) IMPLANT
DRSG OPSITE POSTOP 4X6 (GAUZE/BANDAGES/DRESSINGS) ×1 IMPLANT
ELECT PENCIL ROCKER SW 15FT (MISCELLANEOUS) ×1 IMPLANT
ELECT REM PT RETURN 15FT ADLT (MISCELLANEOUS) ×2 IMPLANT
GAUZE SPONGE 4X4 12PLY STRL (GAUZE/BANDAGES/DRESSINGS) ×1 IMPLANT
GLOVE SURG ENC MOIS LTX SZ7.5 (GLOVE) ×4 IMPLANT
GLOVE SURG LTX SZ8 (GLOVE) ×4 IMPLANT
GOWN STRL REUS W/TWL XL LVL3 (GOWN DISPOSABLE) ×12 IMPLANT
IRRIG SUCT STRYKERFLOW 2 WTIP (MISCELLANEOUS) ×2
IRRIGATION SUCT STRKRFLW 2 WTP (MISCELLANEOUS) IMPLANT
KIT TURNOVER KIT A (KITS) ×2 IMPLANT
LIGASURE IMPACT 36 18CM CVD LR (INSTRUMENTS) IMPLANT
NS IRRIG 1000ML POUR BTL (IV SOLUTION) ×3 IMPLANT
PACK COLON (CUSTOM PROCEDURE TRAY) ×2 IMPLANT
PAD POSITIONING PINK XL (MISCELLANEOUS) ×1 IMPLANT
PENCIL SMOKE EVACUATOR (MISCELLANEOUS) IMPLANT
PORT LAP GEL ALEXIS MED 5-9CM (MISCELLANEOUS) ×1 IMPLANT
PROTECTOR NERVE ULNAR (MISCELLANEOUS) IMPLANT
RELOAD PROXIMATE 75MM BLUE (ENDOMECHANICALS) ×4 IMPLANT
RELOAD STAPLE 75 3.8 BLU REG (ENDOMECHANICALS) IMPLANT
RETRACTOR WND ALEXIS 18 MED (MISCELLANEOUS) IMPLANT
RTRCTR WOUND ALEXIS 18CM MED (MISCELLANEOUS)
SCISSORS LAP 5X35 DISP (ENDOMECHANICALS) ×2 IMPLANT
SEALER TISSUE G2 STRG ARTC 35C (ENDOMECHANICALS) ×1 IMPLANT
SET IRRIG TUBING LAPAROSCOPIC (IRRIGATION / IRRIGATOR) IMPLANT
SET TUBE SMOKE EVAC HIGH FLOW (TUBING) ×2 IMPLANT
SHEARS HARMONIC ACE PLUS 36CM (ENDOMECHANICALS) IMPLANT
SLEEVE ADV FIXATION 5X100MM (TROCAR) ×4 IMPLANT
SLEEVE ENDOPATH XCEL 5M (ENDOMECHANICALS) ×3 IMPLANT
SPONGE T-LAP 18X18 ~~LOC~~+RFID (SPONGE) ×1 IMPLANT
STAPLER 90 3.5 STAND SLIM (STAPLE) ×2
STAPLER 90 3.5 STD SLIM (STAPLE) IMPLANT
STAPLER PROXIMATE 75MM BLUE (STAPLE) ×1 IMPLANT
STAPLER VISISTAT 35W (STAPLE) ×1 IMPLANT
SUT MNCRL AB 4-0 PS2 18 (SUTURE) ×3 IMPLANT
SUT PDS AB 1 CT1 27 (SUTURE) ×4 IMPLANT
SUT PROLENE 2 0 CT2 30 (SUTURE) IMPLANT
SUT PROLENE 2 0 KS (SUTURE) IMPLANT
SUT SILK 2 0 (SUTURE) ×2
SUT SILK 2 0 SH CR/8 (SUTURE) ×1 IMPLANT
SUT SILK 2-0 18XBRD TIE 12 (SUTURE) ×1 IMPLANT
SUT SILK 3 0 (SUTURE)
SUT SILK 3 0 SH CR/8 (SUTURE) ×2 IMPLANT
SUT SILK 3-0 18XBRD TIE 12 (SUTURE) ×1 IMPLANT
SYS LAPSCP GELPORT 120MM (MISCELLANEOUS)
SYSTEM LAPSCP GELPORT 120MM (MISCELLANEOUS) IMPLANT
TAPE CLOTH 4X10 WHT NS (GAUZE/BANDAGES/DRESSINGS) ×1 IMPLANT
TOWEL OR 17X26 10 PK STRL BLUE (TOWEL DISPOSABLE) ×1 IMPLANT
TRAY FOLEY MTR SLVR 14FR STAT (SET/KITS/TRAYS/PACK) ×2 IMPLANT
TRAY FOLEY MTR SLVR 16FR STAT (SET/KITS/TRAYS/PACK) IMPLANT
TROCAR ADV FIXATION 5X100MM (TROCAR) ×2 IMPLANT
TROCAR BALLN 12MMX100 BLUNT (TROCAR) ×1 IMPLANT
TROCAR XCEL 12X100 BLDLESS (ENDOMECHANICALS) ×1 IMPLANT
TROCAR XCEL NON-BLD 11X100MML (ENDOMECHANICALS) IMPLANT
TUBING CONNECTING 10 (TUBING) ×4 IMPLANT
YANKAUER SUCT BULB TIP NO VENT (SUCTIONS) ×1 IMPLANT

## 2021-02-24 NOTE — Op Note (Signed)
PATIENT: Katie Liu  70 y.o. female  Patient Care Team: Adin Hector, MD as PCP - General (Internal Medicine) Clent Jacks, RN as Oncology Nurse Navigator  PREOP DIAGNOSIS: Colon cancer, cecum  POSTOP DIAGNOSIS: Colon cancer, proximal ascending colon  PROCEDURE: Laparoscopic right hemicolectomy  SURGEON: Sharon Mt. Dema Severin, MD  ASSISTANT: Leighton Ruff, MD  ANESTHESIA: General endotracheal  EBL: 50 mL Total I/O In: 1000 [I.V.:1000] Out: 48 [Urine:30; Blood:50]  DRAINS: None  SPECIMEN: Right colon (including terminal ileum, appendix)  COUNTS: Sponge, needle and instrument counts were reported correct x2  FINDINGS: Extensive tattoo evident within the abdominal cavity.  This appears to emanate from the ascending colon.  Mass within the proximal ascending colon.  No obvious metastatic disease on visceral parietal peritoneum or liver.  Right hemicolectomy carried out with a stapled ilio transverse anastomosis.    NARRATIVE:  The patient was identified & brought into the operating room, placed supine on the operating table and SCDs were applied to the lower extremities. General endotracheal anesthesia was induced. The patient was positioned supine with arms tucked. Antibiotics were administered. A foley catheter was placed under sterile conditions. Hair in the region of planned surgery was clipped. The abdomen was prepped and draped in a sterile fashion. A timeout was performed confirming our patient and plan.   Beginning with the extraction port, a supraumbilical incision was made and carried down to the midline fascia. This was then incised with electrocautery. The peritoneum was identified and elevated between clamps and carefully opened sharply. A small Alexis wound protector with a cap and associated port was then placed. The abdomen was insufflated to 15 mmHg with Co2. A laparoscope was placed and camera inspection revealed no evidence of injury. Bilateral TAP  blocks were then performed under laparoscopic visualization using a mixture of 0.25% marcaine with epinepherine + Exparel. 3 additional ports were then placed under direct laparoscopic visualization - two in the left hemiabdomen and one in the right abdomen. The abdomen was surveyed. The liver and peritoneum appeared normal.  There were no signs of metastatic disease.  The patient was positioned in trendelenburg with left side down. The ileocolic pedicle was identified. Gentle blunt dissection commenced around the pedicle and the duodenum was identified and freed from the surrounding structures. I then separated the structures bluntly and used the Enseal device to transect the ileocolic pedicle.  I developed the retroperitoneal plane bluntly.  I then freed the appendix off its attachments to the pelvic wall. I mobilized the terminal ileum.  I took care to avoid injuring any retroperitoneal structures.  After this I began to mobilize laterally down the Shaquandra Galano line of [place]t and then took down the hepatic flexure using the Enseal device.  The tumor does not appear to involve the peritoneum or any surrounding structures.  I mobilized the omentum off of the right transverse colon. The entire colon was then flipped medially and mobilized off of the retroperitoneal structures until I could visualize the lateral edge of the duodenum underneath.  I gently freed the duodenal attachments.   At this point, the abdomen was desufflated and the terminal ileum and right colon delivered through the wound protector. The terminal ileum was then transected using a GIA blue load stapler -this was just distal to an apparent rent in the mesentery related to adhesions where the tattoo was adherent.  A Babcock was placed on the staple line to help maintain orientation.  The remaining mesentery was divided using the  Enseal device. The divided mesentery was inspected and noted to be hemostatic. The distal point of transection was then  identified on the transverse colon at a location that included the right branch of the middle colics, leaving the main middle colic feeding the remaining transverse colon. This was transected using another blue load GIA stapler.  The specimen was then passed off. Attention was turned to creating the anastomosis. The terminal ileum and transverse colon were inspected for orientation to ensure no twisting nor bowel included in the mesenteric defect. There is a nice palpable pulse going out to the transverse colon staple line. The ileum is well perfused. An anastomosis was created between the terminal ileum and the transverse colon using a 75 mm GIA blue load stapler. The staple line was inspected and noted to be hemostatic.  The common enterotomy channel was closed using a TA 90 blue load stapler. Hemostasis was achieved at the staple line using 3-0 silk U-stitches. 3-0 silk sutures were used to imbricate the corners of the staple line as well.  A 2-0 silk suture was placed securing the "crotch" of the anastomosis. The anastomosis was palpated and noted to be widely patent. This was then placed back into the abdomen. The abdomen was then irrigated with sterile saline and hemostasis verified. The omentum was then brought down over the anastomosis. The wound protector cap was replaced and CO2 reinsufflated. The laparoscopic ports were removed under direct visualization and the sites noted to be hemostatic. The Alexis wound protector was removed, counts were reported correct, and we switched to clean instruments, gowns and drapes.   The fascia was then closed using two running #1 PDS sutures.  The skin of all incision sites was closed with 4-0 monocryl subcuticular suture. Dermabond was placed on the port sites and a sterile dressing was placed over the abdominal incision. All counts were reported correct. The patient was then awakened from anesthesia and sent to the post anesthesia care unit in stable condition.    DISPOSITION: PACU in satisfactory condition

## 2021-02-24 NOTE — Anesthesia Procedure Notes (Signed)
Procedure Name: Intubation Date/Time: 02/24/2021 2:19 PM Performed by: Sharlette Dense, CRNA Pre-anesthesia Checklist: Patient identified, Emergency Drugs available, Suction available and Patient being monitored Patient Re-evaluated:Patient Re-evaluated prior to induction Oxygen Delivery Method: Circle system utilized Preoxygenation: Pre-oxygenation with 100% oxygen Induction Type: IV induction Ventilation: Mask ventilation without difficulty Laryngoscope Size: Miller and 2 Grade View: Grade I Tube type: Oral Tube size: 7.0 mm Number of attempts: 1 Airway Equipment and Method: Stylet and Oral airway Placement Confirmation: ETT inserted through vocal cords under direct vision, positive ETCO2 and breath sounds checked- equal and bilateral Secured at: 20 cm Tube secured with: Tape Dental Injury: Teeth and Oropharynx as per pre-operative assessment

## 2021-02-24 NOTE — Plan of Care (Signed)

## 2021-02-24 NOTE — Anesthesia Postprocedure Evaluation (Signed)
Anesthesia Post Note  Patient: Katie Liu  Procedure(s) Performed: LAPAROSCOPIC RIGHT HEMI COLECTOMY     Patient location during evaluation: PACU Anesthesia Type: General Level of consciousness: awake and alert, patient cooperative and oriented Pain management: pain level controlled Vital Signs Assessment: post-procedure vital signs reviewed and stable Respiratory status: spontaneous breathing, nonlabored ventilation and respiratory function stable Cardiovascular status: blood pressure returned to baseline and stable Postop Assessment: no apparent nausea or vomiting Anesthetic complications: no   No notable events documented.  Last Vitals:  Vitals:   02/24/21 1630 02/24/21 1645  BP: 116/62 106/68  Pulse: 74 89  Resp: 14 20  Temp:  36.5 C  SpO2: 96% 96%    Last Pain:  Vitals:   02/24/21 1645  TempSrc:   PainSc: 0-No pain                 Kellina Dreese,E. Malcolm Hetz

## 2021-02-24 NOTE — H&P (Addendum)
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  She developed some degree of abdominal discomfort that was intermittent and vague.  She had a CT scan completed 12/30/20 demonstrates a masslike thickening of the cecum.  No evidence of metastatic disease within the abdomen or pelvis.  CT chest is pending for 01/24/21. Colonoscopy 01/03/21 - Dr. Alice Reichert [facility] - 7 mm polyp in the rectum was removed (TA).  Mass in the ascending colon biopsied and tattooed in 4 quadrants 3 cm distal to the lesion (mod diff invasive adenocarcinoma).  The ileocecal valve was seen and did not appear involved per se.  She is referred to [place] for further evaluation.  CT chest 01/25/21 showed no evidence of metastatic disease.  INTERVAL HISTORY She denies any changes in her health or health history since we met in the office. Tolerated her bowel prep with satisfactory result. She quit smoking 3 weeks ago!  PMH: HTN, HLD, GERD, tobacco use  PSH: C-sx x2  FHx: Denies FHx of colorectal, breast, endometrial, ovarian or cervical cancer  Social: Denies use of EtOH/drugs; +Tobacco use, working on quitting   ROS: A comprehensive 10 system review of systems was completed with the patient and pertinent findings as noted above.  Past Medical History:  Diagnosis Date   Allergy    Arthritis    GERD (gastroesophageal reflux disease)    Hyperlipemia    Vitamin D deficiency     Past Surgical History:  Procedure Laterality Date   ACHILLES TENDON REPAIR Bilateral    CESAREAN SECTION     COLONOSCOPY WITH PROPOFOL N/A 01/03/2021   Procedure: COLONOSCOPY WITH PROPOFOL;  Surgeon: Toledo, Benay Pike, MD;  Location: [facility] ENDOSCOPY;  Service: Gastroenterology;  Laterality: N/A;   ESOPHAGOGASTRODUODENOSCOPY (EGD) WITH PROPOFOL N/A 01/03/2021   Procedure:  ESOPHAGOGASTRODUODENOSCOPY (EGD) WITH PROPOFOL;  Surgeon: Toledo, Benay Pike, MD;  Location: [facility] ENDOSCOPY;  Service: Gastroenterology;  Laterality: N/A;   KNEE ARTHROSCOPY W/ ACL RECONSTRUCTION      Family History  Problem Relation Age of Onset   Cancer Neg Hx     Social:  reports that she quit smoking about 4 weeks ago. Her smoking use included cigarettes. She has a 2.25 pack-year smoking history. She has never used smokeless tobacco. She reports current alcohol use of about 2.0 standard drinks of alcohol per week. She reports that she does not use drugs.  Allergies: No Known Allergies  Medications: I have reviewed the patient's current medications.  Results for orders placed or performed during the hospital encounter of 02/22/21 (from the past 48 hour(s))  SARS CORONAVIRUS 2 (TAT 6-24 HRS) Nasopharyngeal Nasopharyngeal Swab     Status: None   Collection Time: 02/22/21 10:30 AM   Specimen: Nasopharyngeal Swab  Result Value Ref Range   SARS Coronavirus 2 NEGATIVE NEGATIVE    Comment: (NOTE) SARS-CoV-2 target nucleic acids are NOT DETECTED.  The SARS-CoV-2 RNA is generally detectable in upper and lower respiratory specimens during the acute phase of infection. Negative results do not preclude SARS-CoV-2 infection, do not rule out co-infections with other pathogens, and should not be used as the sole basis for treatment or other patient management decisions. Negative results must be combined with clinical observations, patient history, and epidemiological information. The expected result is Negative.  Fact Sheet for Patients: SugarRoll.be  Fact Sheet for Healthcare Providers: https://www.woods-mathews.com/  This test is not yet approved or cleared by the [place] and  has been authorized for detection and/or diagnosis of SARS-CoV-2 by FDA under an Emergency Use Authorization (EUA). This EUA will remain  in effect (meaning this  test can be used) for the duration of the COVID-19 declaration under Se ction 564(b)(1) of the Act, 21 U.S.C. section 360bbb-3(b)(1), unless the authorization is terminated or revoked sooner.  Performed at [facility], [place] [address] [place], [place] [address]     No results found.  ROS - all of the below systems have been reviewed with the patient and positives are indicated with bold text General: chills, fever or night sweats Eyes: blurry vision or double vision ENT: epistaxis or sore throat Allergy/Immunology: itchy/watery eyes or nasal congestion Hematologic/Lymphatic: bleeding problems, blood clots or swollen lymph nodes Endocrine: temperature intolerance or unexpected weight changes Breast: new or changing breast lumps or nipple discharge Resp: cough, shortness of breath, or wheezing CV: chest pain or dyspnea on exertion GI: as per HPI GU: dysuria, trouble voiding, or hematuria MSK: joint pain or joint stiffness Neuro: TIA or stroke symptoms Derm: pruritus and skin lesion changes Psych: anxiety and depression  PE  Constitutional: NAD; conversant; no deformities; wearing mask Eyes: Moist conjunctiva; no lid lag; anicteric; PERRL Neck: Trachea midline; no thyromegaly Lungs: Normal respiratory effort; no tactile fremitus CV: RRR; no palpable thrills; no pitting edema GI: Abd soft, NT/NT; no palpable hepatosplenomegaly MSK: Normal range of motion of extremities; no clubbing/cyanosis Psychiatric: Appropriate affect; alert and oriented x3 Lymphatic: No palpable cervical or axillary lymphadenopathy  Results for orders placed or performed during the hospital encounter of 02/22/21 (from the past 48 hour(s))  SARS CORONAVIRUS 2 (TAT 6-24 HRS) Nasopharyngeal Nasopharyngeal Swab     Status: None   Collection Time: 02/22/21 10:30 AM   Specimen: Nasopharyngeal Swab  Result Value Ref Range   SARS Coronavirus 2 NEGATIVE NEGATIVE    Comment: (NOTE) SARS-CoV-2 target  nucleic acids are NOT DETECTED.  The SARS-CoV-2 RNA is generally detectable in upper and lower respiratory specimens during the acute phase of infection. Negative results do not preclude SARS-CoV-2 infection, do not rule out co-infections with other pathogens, and should not be used as the sole basis for treatment or other patient management decisions. Negative results must be combined with clinical observations, patient history, and epidemiological information. The expected result is Negative.  Fact Sheet for Patients: SugarRoll.be  Fact Sheet for Healthcare Providers: https://www.woods-mathews.com/  This test is not yet approved or cleared by the [place] FDA and  has been authorized for detection and/or diagnosis of SARS-CoV-2 by FDA under an Emergency Use Authorization (EUA). This EUA will remain  in effect (meaning this test can be used) for the duration of the COVID-19 declaration under Se ction 564(b)(1) of the Act, 21 U.S.C. section 360bbb-3(b)(1), unless the authorization is terminated or revoked sooner.  Performed at [facility], [place] [address], [place], [place] [address]     No results found.   A/P: Katie Liu is a very pleasant 35yoF with HTN, HLD, GERD, tobacco use, here for surgery re: invasive adenoCA of the cecum CT A/P 12/2020 - mass in cecum, no evidence of abdominal/pelvic metastatic disease CT Chest 01/25/21 no evident metastatic disease  Colonoscopy 01/03/21 - mass in cecum, biopsied and tattoo'd  -The anatomy and physiology of the GI tract was discussed with the patient and her husband. The pathophysiology of colon cancer was discussed at  length with associated pictures. -We reviewed options for treatment including observation, medical therapy and surgery - with surgery offering the only real potential for cure at this time. We therefore discussed proceeding with laparoscopic right hemicolectomy. -The  planned procedure, material risks (including, but not limited to, pain, bleeding, infection, scarring, need for blood transfusion, damage to surrounding structures- blood vessels/nerves/viscus/organs, damage to ureter, urine leak, leak from anastomosis, need for additional procedures, worsening of pre-existing medical conditions, need for stoma which could be permanent, hernia, recurrence of cancer despite surgery, DVT/PE, pneumonia, heart attack, stroke, death) benefits and alternatives to surgery were discussed at length. The patient's questions were answered to her satisfaction, she voiced understanding and elected to proceed with surgery. Additionally, we discussed typical postoperative expectations and the recovery process and scenarios where chemotherapy after surgery may still be recommended.  Nadeen Landau, MD [facility] Use [email or web address] to contact on call provider

## 2021-02-24 NOTE — Transfer of Care (Signed)
Immediate Anesthesia Transfer of Care Note  Patient: Katie Liu  Procedure(s) Performed: LAPAROSCOPIC RIGHT HEMI COLECTOMY  Patient Location: PACU  Anesthesia Type:General  Level of Consciousness: awake, alert , oriented and patient cooperative  Airway & Oxygen Therapy: Patient Spontanous Breathing and Patient connected to face mask oxygen  Post-op Assessment: Report given to RN, Post -op Vital signs reviewed and stable and Patient moving all extremities X 4  Post vital signs: stable  Last Vitals:  Vitals Value Taken Time  BP 127/77 02/24/21 1620  Temp 36.8 C 02/24/21 1620  Pulse 79 02/24/21 1627  Resp 16 02/24/21 1627  SpO2 94 % 02/24/21 1627  Vitals shown include unvalidated device data.  Last Pain:  Vitals:   02/24/21 1620  TempSrc:   PainSc: 0-No pain         Complications: No notable events documented.

## 2021-02-24 NOTE — Anesthesia Preprocedure Evaluation (Addendum)
Anesthesia Evaluation  Patient identified by MRN, date of birth, ID band Patient awake    Reviewed: Allergy & Precautions, NPO status , Patient's Chart, lab work & pertinent test results  History of Anesthesia Complications (+) PONV  Airway Mallampati: II  TM Distance: >3 FB Neck ROM: Full    Dental  (+) Dental Advisory Given   Pulmonary former smoker,  02/22/2021 SARS coronavirus NEG   breath sounds clear to auscultation       Cardiovascular negative cardio ROS   Rhythm:Regular Rate:Normal     Neuro/Psych negative neurological ROS     GI/Hepatic Neg liver ROS, GERD  Medicated and Controlled,  Endo/Other  negative endocrine ROS  Renal/GU negative Renal ROS     Musculoskeletal  (+) Arthritis ,   Abdominal   Peds  Hematology negative hematology ROS (+)   Anesthesia Other Findings   Reproductive/Obstetrics                            Anesthesia Physical Anesthesia Plan  ASA: 3  Anesthesia Plan: General   Post-op Pain Management:    Induction: Intravenous  PONV Risk Score and Plan: 3 and Ondansetron, Dexamethasone and Scopolamine patch - Pre-op  Airway Management Planned: Oral ETT  Additional Equipment: None  Intra-op Plan:   Post-operative Plan: Extubation in OR  Informed Consent: I have reviewed the patients History and Physical, chart, labs and discussed the procedure including the risks, benefits and alternatives for the proposed anesthesia with the patient or authorized representative who has indicated his/her understanding and acceptance.     Dental advisory given  Plan Discussed with: CRNA and Surgeon  Anesthesia Plan Comments:        Anesthesia Quick Evaluation

## 2021-02-25 ENCOUNTER — Encounter (HOSPITAL_COMMUNITY): Payer: Self-pay | Admitting: Surgery

## 2021-02-25 LAB — CBC
HCT: 30.4 % — ABNORMAL LOW (ref 36.0–46.0)
Hemoglobin: 9.2 g/dL — ABNORMAL LOW (ref 12.0–15.0)
MCH: 27.6 pg (ref 26.0–34.0)
MCHC: 30.3 g/dL (ref 30.0–36.0)
MCV: 91.3 fL (ref 80.0–100.0)
Platelets: 312 10*3/uL (ref 150–400)
RBC: 3.33 MIL/uL — ABNORMAL LOW (ref 3.87–5.11)
RDW: 14.3 % (ref 11.5–15.5)
WBC: 11.5 10*3/uL — ABNORMAL HIGH (ref 4.0–10.5)
nRBC: 0 % (ref 0.0–0.2)

## 2021-02-25 LAB — BASIC METABOLIC PANEL
Anion gap: 5 (ref 5–15)
BUN: 8 mg/dL (ref 8–23)
CO2: 27 mmol/L (ref 22–32)
Calcium: 8.7 mg/dL — ABNORMAL LOW (ref 8.9–10.3)
Chloride: 110 mmol/L (ref 98–111)
Creatinine, Ser: 0.66 mg/dL (ref 0.44–1.00)
GFR, Estimated: >60 mL/min (ref >60)
Glucose, Bld: 116 mg/dL — ABNORMAL HIGH (ref 70–99)
Potassium: 4.4 mmol/L (ref 3.5–5.1)
Sodium: 142 mmol/L (ref 135–145)

## 2021-02-25 MED ORDER — TRAMADOL HCL 50 MG PO TABS: 50.0000 mg | ORAL_TABLET | Freq: Four times a day (QID) | ORAL | 0 refills | Status: AC | PRN

## 2021-02-25 NOTE — Progress Notes (Signed)
Pharmacy Brief Note - Alvimopan (Entereg)  The standing order set for alvimopan (Entereg) now includes an automatic order to discontinue the drug after the patient has had a bowel movement. The change was approved by the [place] and the Medical Executive Committee.   This patient has had bowel movements documented by nursing. Therefore, alvimopan has been discontinued. If there are questions, please contact the pharmacy at [phone].   Thank you- Dia Sitter, PharmD, BCPS 02/25/2021 10:02 AM

## 2021-02-25 NOTE — Progress Notes (Signed)
Initial Nutrition Assessment  INTERVENTION:   -Ensure Surgery po BID, each supplement provides 330 kcal and 18 grams of protein  NUTRITION DIAGNOSIS:   Increased nutrient needs related to post-op healing, cancer and cancer related treatments as evidenced by estimated needs.  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  She developed some degree of abdominal discomfort that was intermittent and vague.  She had a CT scan completed 12/30/20 demonstrates a masslike thickening of the cecum.  7/7: s/p lap right hemicolectomy  Patient now on soft diet, consumed 90% of breakfast. No lunch ordered. Accepting Ensure Surgery supplements. Reports appetite has improved.  Per weight records, pt has lost 10 lbs since 5/16 (7% wt loss x 1.5 months, significant for time frame).   Medications: Vitamin D  Labs reviewed.  NUTRITION - FOCUSED PHYSICAL EXAM:  Will attempt at follow-up if still admitted.  Diet Order:   Diet Order             DIET SOFT Room service appropriate? Yes; Fluid consistency: Thin  Diet effective now                   EDUCATION NEEDS:   No education needs have been identified at this time  Skin:  Skin Assessment: Skin Integrity Issues: Skin Integrity Issues:: Incisions Incisions: 7/7 abdomen  Last BM:  7/8  Height:   Ht Readings from Last 1 Encounters:  02/24/21 5\' 3"  (1.6 m)    Weight:   Wt Readings from Last 1 Encounters:  02/25/21 59.6 kg    BMI:  Body mass index is 23.26 kg/m.  Estimated Nutritional Needs:   Kcal:  1600-1800  Protein:  75-90g  Fluid:  1.8L/day   Clayton Bibles, MS, RD, LDN Inpatient Clinical Dietitian Contact information  available via Amion

## 2021-02-25 NOTE — Evaluation (Signed)
Physical Therapy Evaluation Patient Details Name: Katie Liu MRN: [id number] DOB: 1951/01/26 Today's Date: 02/25/2021   History of Present Illness  Pt s/p Laparoscopic R hemi-colectomy 02/24/21  Clinical Impression  Pt admitted as above and progressing to mobilizing unassisted this session.  Pt educated on log roll technique for in/out bed post op and reported increased ease of movement.  Pt hopeful for dc home tomorrow and with no further PT needs at this time.  PT service will sign off.    Follow Up Recommendations No PT follow up    Equipment Recommendations  None recommended by PT    Recommendations for Other Services       Precautions / Restrictions Restrictions Weight Bearing Restrictions: No      Mobility  Bed Mobility Overal bed mobility: Needs Assistance Bed Mobility: Rolling;Supine to Sit Rolling: Min guard   Supine to sit: Min guard     General bed mobility comments: cues for log roll technique    Transfers Overall transfer level: Modified independent               General transfer comment: no assist sit<>stand  Ambulation/Gait Ambulation/Gait assistance: Min guard;Supervision;Independent Gait Distance (Feet): 450 Feet Assistive device: Rolling walker (2 wheeled);1 person hand held assist;None Gait Pattern/deviations: Step-through pattern;Decreased step length - right;Decreased step length - left;Shuffle     General Gait Details: Pt with initial mild instability but with noted improvement with increased distance ambulated and pt progressed to ambulating unassisted in hall.  Stairs            Wheelchair Mobility    Modified Rankin (Stroke Patients Only)       Balance Overall balance assessment: Independent                                           Pertinent Vitals/Pain Pain Assessment: 0-10 Pain Score: 8  Pain Location: R shoulder; min c/o abdominal pain Pain Descriptors / Indicators:  Aching;Grimacing;Guarding;Sore Pain Intervention(s): Limited activity within patient's tolerance;Monitored during session;Premedicated before session;Patient requesting pain meds-RN notified    Home Living Family/patient expects to be discharged to:: Private residence Living Arrangements: Spouse/significant other Available Help at Discharge: Family Type of Home: House Home Access: Stairs to enter Entrance Stairs-Rails: Right Entrance Stairs-Number of Steps: 5 Home Layout: One level Home Equipment: Environmental consultant - 2 wheels      Prior Function Level of Independence: Independent               Hand Dominance        Extremity/Trunk Assessment   Upper Extremity Assessment Upper Extremity Assessment: RUE deficits/detail RUE Deficits / Details: Pt using functionally but reports significant pain    Lower Extremity Assessment Lower Extremity Assessment: Overall WFL for tasks assessed    Cervical / Trunk Assessment Cervical / Trunk Assessment: Normal  Communication   Communication: No difficulties  Cognition Arousal/Alertness: Awake/alert Behavior During Therapy: WFL for tasks assessed/performed Overall Cognitive Status: Within Functional Limits for tasks assessed                                        General Comments      Exercises     Assessment/Plan    PT Assessment Patent does not need any further PT services  PT Problem List  PT Treatment Interventions      PT Goals (Current goals can be found in the Care Plan section)  Acute Rehab PT Goals Patient Stated Goal: HOME PT Goal Formulation: All assessment and education complete, DC therapy    Frequency     Barriers to discharge        Co-evaluation               AM-PAC PT "6 Clicks" Mobility  Outcome Measure Help needed turning from your back to your side while in a flat bed without using bedrails?: A Little Help needed moving from lying on your back to sitting on the side  of a flat bed without using bedrails?: A Little Help needed moving to and from a bed to a chair (including a wheelchair)?: None Help needed standing up from a chair using your arms (e.g., wheelchair or bedside chair)?: None Help needed to walk in hospital room?: A Little Help needed climbing 3-5 steps with a railing? : A Little 6 Click Score: 20    End of Session Equipment Utilized During Treatment: Gait belt Activity Tolerance: Patient tolerated treatment well Patient left: in chair;with call bell/phone within reach;with chair alarm set;with nursing/sitter in room Nurse Communication: Mobility status PT Visit Diagnosis: Unsteadiness on feet (R26.81)    Time: 1025-1040 PT Time Calculation (min) (ACUTE ONLY): 15 min   Charges:   PT Evaluation $PT Eval Low Complexity: 1 Low          [place] Pager [phone] Office [phone]   Marelin Tat 02/25/2021, 12:34 PM

## 2021-02-25 NOTE — Progress Notes (Signed)
Foley removed this morning per MD order. Patient ambulated in the hall 800 feet and tolerated it well.   Patient has voided and had a bowel movement since foley was removed. Day shift nurses made aware.  Bedside report completed.

## 2021-02-25 NOTE — Progress Notes (Signed)
Subjective No acute events. She feels quite well. Having flatus and 2 Bms. She denies any nausea or vomiting. Hungry. She denies any incision tenderness or abdominal pain  Objective: Vital signs in last 24 hours: Temp:  [97.4 F (36.3 C)-98.6 F (37 C)] 98.6 F (37 C) (07/08 0434) Pulse Rate:  [51-91] 51 (07/08 0434) Resp:  [11-20] 16 (07/08 0434) BP: (99-134)/(58-78) 112/58 (07/08 0434) SpO2:  [96 %-100 %] 98 % (07/08 0434) Weight:  [59.6 kg] 59.6 kg (07/08 0600) Last BM Date: 02/25/21  Intake/Output from previous day: 07/07 0701 - 07/08 0700 In: 2796.3 [P.O.:840; I.V.:1856.3; IV Piggyback:100] Out: 1880 [Urine:1830; Blood:50] Intake/Output this shift: Total I/O In: 990 [P.O.:240; I.V.:750] Out: 0   Gen: NAD, comfortable CV: RRR Pulm: Normal work of breathing Abd: Soft, nontender throughout; nondistended Ext: SCDs in place  Lab Results: CBC  Recent Labs    02/25/21 0422  WBC 11.5*  HGB 9.2*  HCT 30.4*  PLT 312   BMET Recent Labs    02/25/21 0422  NA 142  K 4.4  CL 110  CO2 27  GLUCOSE 116*  BUN 8  CREATININE 0.66  CALCIUM 8.7*   PT/INR No results for input(s): LABPROT, INR in the last 72 hours. ABG No results for input(s): PHART, HCO3 in the last 72 hours.  Invalid input(s): PCO2, PO2  Studies/Results:  Anti-infectives: Anti-infectives (From admission, onward)    Start     Dose/Rate Route Frequency Ordered Stop   02/24/21 1145  cefoTEtan (CEFOTAN) 2 g in sodium chloride 0.9 % 100 mL IVPB        2 g 200 mL/hr over 30 Minutes Intravenous On call to O.R. 02/24/21 1131 02/24/21 1439        Assessment/Plan: Patient Active Problem List   Diagnosis Date Noted   Colon cancer ([place]) 02/24/2021   Malignant neoplasm of ascending colon ([place]) 01/11/2021   Goals of care, counseling/discussion 01/11/2021   Leukocytosis 01/11/2021   Tobacco use 01/11/2021   s/p Procedure(s): LAPAROSCOPIC RIGHT HEMI COLECTOMY 02/24/2021  -We spent time reviewing her  procedure, findings and plans moving forward; answered her questions -Ambulate 5x/day -D/C IVF, D/C foley -Soft diet as tolerated -Ppx: SQH, SCDs  Path pending   LOS: 1 day   Nadeen Landau, MD [facility] Use [email or web address] to contact on call provider

## 2021-02-25 NOTE — Discharge Instructions (Signed)
POST OP INSTRUCTIONS AFTER COLON SURGERY  DIET: Be sure to include lots of fluids daily to stay hydrated - 64oz of water per day (8, 8 oz glasses).  Avoid fast food or heavy meals for the first couple of weeks as your are more likely to get nauseated. Avoid raw/uncooked fruits or vegetables for the first 4 weeks (its ok to have these if they are blended into smoothie form). If you have fruits/vegetables, make sure they are cooked until soft enough to mash on the roof of your mouth and chew your food well. Otherwise, diet as tolerated.  Take your usually prescribed home medications unless otherwise directed.  PAIN CONTROL: Pain is best controlled by a usual combination of three different methods TOGETHER: Ice/Heat Over the counter pain medication Prescription pain medication Most patients will experience some swelling and bruising around the surgical site.  Ice packs or heating pads (30-60 minutes up to 6 times a day) will help. Some people prefer to use ice alone, heat alone, alternating between ice & heat.  Experiment to what works for you.  Swelling and bruising can take several weeks to resolve.   It is helpful to take an over-the-counter pain medication regularly for the first few weeks: Ibuprofen (Motrin/Advil) - 200mg tabs - take 3 tabs (600mg) every 6 hours as needed for pain (unless you have been directed previously to avoid NSAIDs/ibuprofen) Acetaminophen (Tylenol) - you may take 650mg every 6 hours as needed. You can take this with motrin as they act differently on the body. If you are taking a narcotic pain medication that has acetaminophen in it, do not take over the counter tylenol at the same time. NOTE: You may take both of these medications together - most patients  find it most helpful when alternating between the two (i.e. Ibuprofen at 6am, tylenol at 9am, ibuprofen at 12pm ...) A  prescription for pain medication should be given to you upon discharge.  Take your pain medication as  prescribed if your pain is not adequatly controlled with the over-the-counter pain reliefs mentioned above.  Avoid getting constipated.  Between the surgery and the pain medications, it is common to experience some constipation.  Increasing fluid intake and taking a fiber supplement (such as Metamucil, Citrucel, FiberCon, MiraLax, etc) 1-2 times a day regularly will usually help prevent this problem from occurring.  A mild laxative (prune juice, Milk of Magnesia, MiraLax, etc) should be taken according to package directions if there are no bowel movements after 48 hours.    Dressing: Your incisions are covered in Dermabond which is like sterile superglue for the skin. This will come off on it's own in a couple weeks. It is waterproof and you may bathe normally starting the day after your surgery in a shower. Avoid baths/pools/lakes/oceans until your wounds have fully healed.  ACTIVITIES as tolerated:   Avoid heavy lifting (>10lbs or 1 gallon of milk) for the next 6 weeks. You may resume regular daily activities as tolerated--such as daily self-care, walking, climbing stairs--gradually increasing activities as tolerated.  If you can walk 30 minutes without difficulty, it is safe to try more intense activity such as jogging, treadmill, bicycling, low-impact aerobics.  DO NOT PUSH THROUGH PAIN.  Let pain be your guide: If it hurts to do something, don't do it. You may drive when you are no longer taking prescription pain medication, you can comfortably wear a seatbelt, and you can safely maneuver your car and apply brakes.  FOLLOW UP in our   office Please call [facility] at [phone] to set up an appointment to see your surgeon in the office for a follow-up appointment approximately 2 weeks after your surgery. Make sure that you call for this appointment the day you arrive home to insure a convenient appointment time.  9. If you have disability or family leave forms that need to be completed, you may have  them completed by your primary care physician's office; for return to work instructions, please ask our office staff and they will be happy to assist you in obtaining this documentation   When to call us [phone]: Poor pain control Reactions / problems with new medications (rash/itching, etc)  Fever over 101.5 F (38.5 C) Inability to urinate Nausea/vomiting Worsening swelling or bruising Continued bleeding from incision. Increased pain, redness, or drainage from the incision  The clinic staff is available to answer your questions during regular business hours (8:30am-5pm).  Please don't hesitate to call and ask to speak to one of our nurses for clinical concerns.   A surgeon from [facility] is always on call at the hospitals   If you have a medical emergency, go to the nearest emergency room or call 911.  [facility] [address], [address], [place], [place]  [address] MAIN: [phone] FAX: [phone] [email or web address]  

## 2021-02-26 LAB — CBC
HCT: 26.4 % — ABNORMAL LOW (ref 36.0–46.0)
Hemoglobin: 8.2 g/dL — ABNORMAL LOW (ref 12.0–15.0)
MCH: 28.2 pg (ref 26.0–34.0)
MCHC: 31.1 g/dL (ref 30.0–36.0)
MCV: 90.7 fL (ref 80.0–100.0)
Platelets: 318 10*3/uL (ref 150–400)
RBC: 2.91 MIL/uL — ABNORMAL LOW (ref 3.87–5.11)
RDW: 14.8 % (ref 11.5–15.5)
WBC: 10.8 10*3/uL — ABNORMAL HIGH (ref 4.0–10.5)
nRBC: 0 % (ref 0.0–0.2)

## 2021-02-26 LAB — BASIC METABOLIC PANEL
Anion gap: 7 (ref 5–15)
BUN: 11 mg/dL (ref 8–23)
CO2: 28 mmol/L (ref 22–32)
Calcium: 8.7 mg/dL — ABNORMAL LOW (ref 8.9–10.3)
Chloride: 108 mmol/L (ref 98–111)
Creatinine, Ser: 0.68 mg/dL (ref 0.44–1.00)
GFR, Estimated: >60 mL/min (ref >60)
Glucose, Bld: 78 mg/dL (ref 70–99)
Potassium: 3.6 mmol/L (ref 3.5–5.1)
Sodium: 143 mmol/L (ref 135–145)

## 2021-02-26 NOTE — Progress Notes (Signed)
Assessment unchanged. Pt verbalized understanding of dc instructions through teach back. Discharged to front entrance accompanied by NT and Nurse.

## 2021-02-26 NOTE — Discharge Summary (Signed)
Physician Discharge Summary  Patient ID: Katie Liu MRN: [id number] DOB/AGE: July 06, 1951 70 y.o.  Admit date: 02/24/2021 Discharge date: 02/26/2021  Admission Diagnoses: cecal adenocarcinoma  Discharge Diagnoses:  Active Problems:   Colon cancer [facility])   Discharged Condition: good  Hospital Course: Katie Liu is a 70 yo female with a newly-diagnosed cecal adenocarcinoma. She was taken to the OR on 02/24/21 for a laparoscopic right hemicolectomy. Please see Dr. Orest Dikes separately dictated operative note for further details of the procedure. Postoperatively she was admitted to the floor in stable condition. She was started on a liquid diet, which she tolerated. Foley was removed on POD1. She was advanced to a soft diet, which she tolerated with no nausea or vomiting. She worked with therapies and ambulated without difficulty. On POD2 she was tolerating PO intake, pain was controlled, she was having bowel function, and she was ambulating. She was examined and deemed appropriate for discharge home.  Consults: None  Significant Diagnostic Studies: N/A  Treatments: surgery: laparoscopic right hemicolectomy  Discharge Exam: Blood pressure 129/65, pulse (!) 54, temperature 98.2 F (36.8 C), temperature source Oral, resp. rate 16, height 5\' 3"  (1.6 m), weight 59.6 kg, SpO2 98 %. General appearance: alert, cooperative, and appears stated age Head: Normocephalic, without obvious abnormality, atraumatic Neck: supple, symmetrical, trachea midline Resp: nonlabored respirations on room air GI: soft, nontender, nondistended Extremities: extremities normal, atraumatic, no cyanosis or edema Neurologic: Grossly normal Incision/Wound: lap incisions clean, dry and in tact with no erythema, induration or drainage  Disposition: Discharge disposition: 01-Home or Self Care       Discharge Instructions     Call MD for:  persistant nausea and vomiting   Complete by: As directed    Call MD for:   redness, tenderness, or signs of infection (pain, swelling, redness, odor or green/yellow discharge around incision site)   Complete by: As directed    Call MD for:  severe uncontrolled pain   Complete by: As directed    Call MD for:  temperature >100.4   Complete by: As directed    Increase activity slowly   Complete by: As directed       Allergies as of 02/26/2021   No Known Allergies      Medication List     TAKE these medications    aspirin 81 MG chewable tablet Chew 81 mg by mouth daily.   atorvastatin 40 MG tablet Commonly known as: LIPITOR Take 40 mg by mouth daily.   cetirizine 10 MG tablet Commonly known as: ZYRTEC Take 10 mg by mouth daily as needed for allergies.   ibuprofen 200 MG tablet Commonly known as: ADVIL Take 200 mg by mouth daily as needed (Stomach pain).   nicotine 7 mg/24hr patch Commonly known as: NICODERM CQ - dosed in mg/24 hr Place 7 mg onto the skin daily.   NON FORMULARY Place 7 drops under the tongue at bedtime. CBD oil (contains NO "THC"):   Omega-3 1000 MG Caps Take 1,000 mg by mouth daily.   omeprazole 40 MG capsule Commonly known as: PRILOSEC Take 40 mg by mouth daily.   PHILLIPS COLON HEALTH PO Take 1 capsule by mouth daily.   sertraline 25 MG tablet Commonly known as: ZOLOFT Take 25 mg by mouth daily.   SYSTANE BALANCE OP Place 1-2 drops into both eyes 2 (two) times daily as needed (for dryness or irritation).   traMADol 50 MG tablet Commonly known as: Ultram Take 1 tablet (50 mg total) by  mouth every 6 (six) hours as needed for up to 5 days (postop pain not controlled with tylenol and ibuprofen first).   Vitamin D3 25 MCG (1000 UT) Caps Take 2,000 Units by mouth at bedtime.        Follow-up Information     Ileana Roup, MD Follow up in 2 week(s).   Specialties: General Surgery, Colon and Rectal Surgery Why: 2-3 weeks for postop follow-up; call to confirm Contact information: [place] [phone]                 Signed: Dwan Bolt 02/26/2021, 8:45 AM

## 2021-02-28 ENCOUNTER — Other Ambulatory Visit: Payer: Self-pay

## 2021-02-28 DIAGNOSIS — R238 Other skin changes: Secondary | ICD-10-CM | POA: Diagnosis not present

## 2021-03-01 ENCOUNTER — Other Ambulatory Visit (HOSPITAL_COMMUNITY): Payer: Self-pay

## 2021-03-02 ENCOUNTER — Inpatient Hospital Stay: Payer: Medicare HMO | Attending: Oncology | Admitting: Oncology

## 2021-03-02 ENCOUNTER — Encounter: Payer: Self-pay | Admitting: Oncology

## 2021-03-02 VITALS — BP 119/73 | HR 84 | Temp 98.2°F | Resp 18 | Wt 136.0 lb

## 2021-03-02 DIAGNOSIS — Z720 Tobacco use: Secondary | ICD-10-CM | POA: Diagnosis not present

## 2021-03-02 DIAGNOSIS — C182 Malignant neoplasm of ascending colon: Secondary | ICD-10-CM | POA: Diagnosis not present

## 2021-03-02 DIAGNOSIS — Z7982 Long term (current) use of aspirin: Secondary | ICD-10-CM | POA: Insufficient documentation

## 2021-03-02 DIAGNOSIS — F17210 Nicotine dependence, cigarettes, uncomplicated: Secondary | ICD-10-CM | POA: Insufficient documentation

## 2021-03-02 DIAGNOSIS — E785 Hyperlipidemia, unspecified: Secondary | ICD-10-CM | POA: Insufficient documentation

## 2021-03-02 DIAGNOSIS — D649 Anemia, unspecified: Secondary | ICD-10-CM | POA: Diagnosis not present

## 2021-03-02 DIAGNOSIS — Z9049 Acquired absence of other specified parts of digestive tract: Secondary | ICD-10-CM | POA: Diagnosis not present

## 2021-03-02 DIAGNOSIS — Z79899 Other long term (current) drug therapy: Secondary | ICD-10-CM | POA: Diagnosis not present

## 2021-03-02 DIAGNOSIS — Z7189 Other specified counseling: Secondary | ICD-10-CM | POA: Diagnosis not present

## 2021-03-02 LAB — SURGICAL PATHOLOGY

## 2021-03-02 NOTE — Progress Notes (Signed)
Patient here for oncology follow-up appointment, expresses concerns of surgical pian and loose stools

## 2021-03-02 NOTE — Progress Notes (Signed)
Hematology/Oncology Consult note [facility] Telephone:([phone] [phone]   Patient Care Team: Adin Hector, MD as PCP - General (Internal Medicine) Clent Jacks, RN as Oncology Nurse Navigator  REFERRING PROVIDER: Adin Hector, MD  CHIEF COMPLAINTS/REASON FOR VISIT:  right colon cancer  HISTORY OF PRESENTING ILLNESS:   Katie Liu is a  70 y.o.  female with PMH listed below was seen in consultation at the request of  Tama High III, MD  for evaluation of right colon cancer  12/21/2020, patient was referred to [facility] gastroenterology Dr. Tina Griffiths for evaluation of generalized postprandial abdominal pain and unintentional weight loss.  Symptom onset is 2 to 3 months, some nausea without emesis.  20 pounds of unintentional weight loss within the past few months.  Early satiety, only eating one third of her plate. 12/30/2020, CT abdomen/pelvis angiogram was obtained for evaluation of postprandial abdominal pain. There was no significant narrowing of the mesenteric arteries or veins.  Masslike thickening of the wall of the cecum spacious for malignancy 01/03/2021, EGD showed esophageal mucosal.  1cm hiatal hernia Colonoscopy findings includes 7 mm polyp in the rectum, resected and retrieved, nonbleeding internal hemorrhoids. Malignant partially obstructing tumor in the proximal ascending colon.  Tattooed and biopsied.  Patient was referred to establish care with oncology for further evaluation and management. Is any family history of cancer. Current everyday smoker, 2 packs of cigarettes per week..  She denies any change of bowel habits, black or bloody stool. Patient is accompanied by her husband.   INTERVAL HISTORY Katie Liu is a 70 y.o. female who has above history reviewed by me today presents for follow up visit for management of stage II right colon cancer Problems and complaints are listed  below: 02/24/2021 patient underwent right hemicolectomy. Invasive adenocarcinoma with mucinous features 8cm, grade 2, negative LVI or perineural invasion. carcinoma extends into pericolonic connective tissue. Margin is negative. 0/21 lymph nodes positive.  pT3 pN0, stage II   Patient reports feeling very well today. Recovered from her surgery. No abdominal pain, fever chills.  Appetite is good. Weight is stable.   Review of Systems  Constitutional:  Negative for appetite change, chills, fatigue, fever and unexpected weight change.  HENT:   Negative for hearing loss and voice change.   Eyes:  Negative for eye problems.  Respiratory:  Negative for chest tightness and cough.   Cardiovascular:  Negative for chest pain.  Gastrointestinal:  Negative for abdominal distention, abdominal pain and blood in stool.  Endocrine: Negative for hot flashes.  Genitourinary:  Negative for difficulty urinating and frequency.   Musculoskeletal:  Negative for arthralgias.  Skin:  Negative for itching and rash.  Neurological:  Negative for extremity weakness.  Hematological:  Negative for adenopathy.  Psychiatric/Behavioral:  Negative for confusion.    MEDICAL HISTORY:  Past Medical History:  Diagnosis Date   Allergy    Arthritis    GERD (gastroesophageal reflux disease)    Hyperlipemia    Vitamin D deficiency     SURGICAL HISTORY: Past Surgical History:  Procedure Laterality Date   ACHILLES TENDON REPAIR Bilateral    CESAREAN SECTION     COLONOSCOPY WITH PROPOFOL N/A 01/03/2021   Procedure: COLONOSCOPY WITH PROPOFOL;  Surgeon: Toledo, Benay Pike, MD;  Location: [facility] ENDOSCOPY;  Service: Gastroenterology;  Laterality: N/A;   ESOPHAGOGASTRODUODENOSCOPY (EGD) WITH PROPOFOL N/A 01/03/2021   Procedure: ESOPHAGOGASTRODUODENOSCOPY (EGD) WITH PROPOFOL;  Surgeon: Alice Reichert, Benay Pike, MD;  Location: [facility] ENDOSCOPY;  Service: Gastroenterology;  Laterality: N/A;   KNEE ARTHROSCOPY W/ ACL RECONSTRUCTION      LAPAROSCOPIC RIGHT HEMI COLECTOMY N/A 02/24/2021   Procedure: LAPAROSCOPIC RIGHT HEMI COLECTOMY;  Surgeon: Ileana Roup, MD;  Location: [facility];  Service: General;  Laterality: N/A;    SOCIAL HISTORY: Social History   Socioeconomic History   Marital status: Married    Spouse name: Not on file   Number of children: Not on file   Years of education: Not on file   Highest education level: Not on file  Occupational History   Not on file  Tobacco Use   Smoking status: Former    Packs/day: 0.25    Years: 9.00    Pack years: 2.25    Types: Cigarettes    Quit date: 01/25/2021    Years since quitting: 0.0   Smokeless tobacco: Never  Vaping Use   Vaping Use: Never used  Substance and Sexual Activity   Alcohol use: Yes    Alcohol/week: 2.0 standard drinks    Types: 2 Glasses of wine per week    Comment: occasional glass of wine.   Drug use: Never   Sexual activity: Not on file  Other Topics Concern   Not on file  Social History Narrative   Not on file   Social Determinants of Health   Financial Resource Strain: Not on file  Food Insecurity: Not on file  Transportation Needs: Not on file  Physical Activity: Not on file  Stress: Not on file  Social Connections: Not on file  Intimate Partner Violence: Not on file    FAMILY HISTORY: Family History  Problem Relation Age of Onset   Cancer Neg Hx     ALLERGIES:  has No Known Allergies.  MEDICATIONS:  Current Outpatient Medications  Medication Sig Dispense Refill   aspirin 81 MG chewable tablet Chew 81 mg by mouth daily.     atorvastatin (LIPITOR) 40 MG tablet Take 40 mg by mouth daily.     cetirizine (ZYRTEC) 10 MG tablet Take 10 mg by mouth daily as needed for allergies.     Cholecalciferol (VITAMIN D3) 25 MCG (1000 UT) CAPS Take 2,000 Units by mouth at bedtime.     ibuprofen (ADVIL,MOTRIN) 200 MG tablet Take 200 mg by mouth daily as needed (Stomach pain).     nicotine (NICODERM CQ - DOSED IN MG/24 HR) 7 mg/24hr  patch Place 7 mg onto the skin daily.     NON FORMULARY Place 7 drops under the tongue at bedtime. CBD oil (contains NO "THC"):     Omega-3 1000 MG CAPS Take 1,000 mg by mouth daily.     omeprazole (PRILOSEC) 40 MG capsule Take 40 mg by mouth daily.     Probiotic Product (PHILLIPS COLON HEALTH PO) Take 1 capsule by mouth daily.     Propylene Glycol (SYSTANE BALANCE OP) Place 1-2 drops into both eyes 2 (two) times daily as needed (for dryness or irritation).     sertraline (ZOLOFT) 25 MG tablet Take 25 mg by mouth daily.     traMADol (ULTRAM) 50 MG tablet Take 1 tablet (50 mg total) by mouth every 6 (six) hours as needed for up to 5 days (postop pain not controlled with tylenol and ibuprofen first). 10 tablet 0   No current facility-administered medications for this visit.     PHYSICAL EXAMINATION: ECOG PERFORMANCE STATUS: 1 - Symptomatic but completely ambulatory Vitals:   03/02/21 1421  BP: 119/73  Pulse: 84  Resp: 18  Temp: 98.2 F (36.8 C)  SpO2: 98%   Filed Weights   03/02/21 1421  Weight: 136 lb (61.7 kg)    Physical Exam Constitutional:      General: She is not in acute distress. HENT:     Head: Normocephalic and atraumatic.  Eyes:     General: No scleral icterus. Cardiovascular:     Rate and Rhythm: Normal rate and regular rhythm.     Heart sounds: Normal heart sounds.  Pulmonary:     Effort: Pulmonary effort is normal. No respiratory distress.     Breath sounds: No wheezing.  Abdominal:     General: Bowel sounds are normal. There is no distension.     Palpations: Abdomen is soft.  Musculoskeletal:        General: No deformity. Normal range of motion.     Cervical back: Normal range of motion and neck supple.  Skin:    General: Skin is warm and dry.     Findings: No erythema or rash.  Neurological:     Mental Status: She is alert and oriented to person, place, and time. Mental status is at baseline.     Cranial Nerves: No cranial nerve deficit.      Coordination: Coordination normal.  Psychiatric:        Mood and Affect: Mood normal.    LABORATORY DATA:  I have reviewed the data as listed Lab Results  Component Value Date   WBC 10.8 (H) 02/26/2021   HGB 8.2 (L) 02/26/2021   HCT 26.4 (L) 02/26/2021   MCV 90.7 02/26/2021   PLT 318 02/26/2021   Recent Labs    01/11/21 1203 02/25/21 0422 02/26/21 0358  NA 144 142 143  K 4.3 4.4 3.6  CL 104 110 108  CO2 27 27 28   GLUCOSE 87 116* 78  BUN 8 8 11   CREATININE 0.72 0.66 0.68  CALCIUM 9.2 8.7* 8.7*  GFRNONAA >60 >60 >60  PROT 7.7  --   --   ALBUMIN 3.9  --   --   AST 19  --   --   ALT 14  --   --   ALKPHOS 93  --   --   BILITOT 0.6  --   --     Iron/TIBC/Ferritin/ %Sat No results found for: IRON, TIBC, FERRITIN, IRONPCTSAT    RADIOGRAPHIC STUDIES: I have personally reviewed the radiological images as listed and agreed with the findings in the report. No results found.     ASSESSMENT & PLAN:  1. Malignant neoplasm of ascending colon ([place])   2. Goals of care, counseling/discussion   3. Tobacco use   4. Normocytic anemia   Cancer Staging Malignant neoplasm of ascending colon [facility]) Staging form: Colon and Rectum, AJCC 8th Edition - Pathologic stage from 03/02/2021: Stage IIA (pT3, pN0, cM0) - Signed by Earlie Server, MD on 03/02/2021  #Ascending colon carcinoma with mucinous feature, pT3 pN0 cMx Pathology results and image findings were cussed with patient. Baseline CEA is elevated at 5.  Stage II disease, no high risk features,  I will not offer adjuvant chemotherapy Recommend history and physical examination/labs every 3-6 months, CT every 6 months,  for first 2 years followed by annual CT images upt to year 5.  Smoke cessation.   Anemia, likely due to blood loss. Hb 8.2 Recommend patient to take multiple vitamin contains iron.    Orders Placed This Encounter  Procedures   Comprehensive  metabolic panel    Standing Status:   Future    Standing Expiration  Date:   03/02/2022   CBC with Differential/Platelet    Standing Status:   Future    Standing Expiration Date:   03/02/2022   CEA    Standing Status:   Future    Standing Expiration Date:   03/02/2022   Retic Panel    Standing Status:   Future    Standing Expiration Date:   03/02/2022    All questions were answered. The patient knows to call the clinic with any problems questions or concerns.  cc Adin Hector, MD    Return of visit:  3 months, lab -cbc cmp  CEA, MD    Earlie Server, MD, PhD Hematology Oncology [place] at [facility]  03/02/2021

## 2021-03-09 ENCOUNTER — Other Ambulatory Visit: Payer: Self-pay

## 2021-03-09 NOTE — Progress Notes (Signed)
The proposed treatment discussed in conference is for discussion purpose only and is not a binding recommendation.  The patients have not been physically examined, or presented with their treatment options.  Therefore, final treatment plans cannot be decided.  

## 2021-04-03 DIAGNOSIS — Z20822 Contact with and (suspected) exposure to covid-19: Secondary | ICD-10-CM | POA: Diagnosis not present

## 2021-04-03 DIAGNOSIS — R0981 Nasal congestion: Secondary | ICD-10-CM | POA: Diagnosis not present

## 2021-04-03 DIAGNOSIS — J040 Acute laryngitis: Secondary | ICD-10-CM | POA: Diagnosis not present

## 2021-04-03 DIAGNOSIS — R059 Cough, unspecified: Secondary | ICD-10-CM | POA: Diagnosis not present

## 2021-04-05 DIAGNOSIS — D2271 Melanocytic nevi of right lower limb, including hip: Secondary | ICD-10-CM | POA: Diagnosis not present

## 2021-04-05 DIAGNOSIS — L820 Inflamed seborrheic keratosis: Secondary | ICD-10-CM | POA: Diagnosis not present

## 2021-04-05 DIAGNOSIS — L538 Other specified erythematous conditions: Secondary | ICD-10-CM | POA: Diagnosis not present

## 2021-04-05 DIAGNOSIS — D2262 Melanocytic nevi of left upper limb, including shoulder: Secondary | ICD-10-CM | POA: Diagnosis not present

## 2021-04-05 DIAGNOSIS — D225 Melanocytic nevi of trunk: Secondary | ICD-10-CM | POA: Diagnosis not present

## 2021-04-05 DIAGNOSIS — L821 Other seborrheic keratosis: Secondary | ICD-10-CM | POA: Diagnosis not present

## 2021-04-05 DIAGNOSIS — R208 Other disturbances of skin sensation: Secondary | ICD-10-CM | POA: Diagnosis not present

## 2021-04-05 DIAGNOSIS — D2272 Melanocytic nevi of left lower limb, including hip: Secondary | ICD-10-CM | POA: Diagnosis not present

## 2021-04-05 DIAGNOSIS — D2261 Melanocytic nevi of right upper limb, including shoulder: Secondary | ICD-10-CM | POA: Diagnosis not present

## 2021-05-31 ENCOUNTER — Inpatient Hospital Stay: Payer: Medicare HMO | Attending: Oncology

## 2021-05-31 ENCOUNTER — Other Ambulatory Visit: Payer: Self-pay

## 2021-05-31 DIAGNOSIS — C182 Malignant neoplasm of ascending colon: Secondary | ICD-10-CM | POA: Diagnosis not present

## 2021-05-31 DIAGNOSIS — F17210 Nicotine dependence, cigarettes, uncomplicated: Secondary | ICD-10-CM | POA: Diagnosis not present

## 2021-05-31 DIAGNOSIS — Z7982 Long term (current) use of aspirin: Secondary | ICD-10-CM | POA: Insufficient documentation

## 2021-05-31 DIAGNOSIS — Z79899 Other long term (current) drug therapy: Secondary | ICD-10-CM | POA: Insufficient documentation

## 2021-05-31 DIAGNOSIS — E785 Hyperlipidemia, unspecified: Secondary | ICD-10-CM | POA: Diagnosis not present

## 2021-05-31 LAB — RETIC PANEL
Immature Retic Fract: 6.4 % (ref 2.3–15.9)
RBC.: 4.84 MIL/uL (ref 3.87–5.11)
Retic Count, Absolute: 56.1 10*3/uL (ref 19.0–186.0)
Retic Ct Pct: 1.2 % (ref 0.4–3.1)
Reticulocyte Hemoglobin: 36 pg (ref >27.9)

## 2021-05-31 LAB — CBC WITH DIFFERENTIAL/PLATELET
Abs Immature Granulocytes: 0.07 10*3/uL (ref 0.00–0.07)
Basophils Absolute: 0.1 10*3/uL (ref 0.0–0.1)
Basophils Relative: 1 %
Eosinophils Absolute: 0.1 10*3/uL (ref 0.0–0.5)
Eosinophils Relative: 1 %
HCT: 41.9 % (ref 36.0–46.0)
Hemoglobin: 14 g/dL (ref 12.0–15.0)
Immature Granulocytes: 1 %
Lymphocytes Relative: 38 %
Lymphs Abs: 3.2 10*3/uL (ref 0.7–4.0)
MCH: 29.3 pg (ref 26.0–34.0)
MCHC: 33.4 g/dL (ref 30.0–36.0)
MCV: 87.7 fL (ref 80.0–100.0)
Monocytes Absolute: 0.5 10*3/uL (ref 0.1–1.0)
Monocytes Relative: 6 %
Neutro Abs: 4.6 10*3/uL (ref 1.7–7.7)
Neutrophils Relative %: 53 %
Platelets: 241 10*3/uL (ref 150–400)
RBC: 4.78 MIL/uL (ref 3.87–5.11)
RDW: 16.6 % — ABNORMAL HIGH (ref 11.5–15.5)
WBC: 8.5 10*3/uL (ref 4.0–10.5)
nRBC: 0 % (ref 0.0–0.2)

## 2021-05-31 LAB — COMPREHENSIVE METABOLIC PANEL
ALT: 26 U/L (ref 0–44)
AST: 25 U/L (ref 15–41)
Albumin: 4.6 g/dL (ref 3.5–5.0)
Alkaline Phosphatase: 82 U/L (ref 38–126)
Anion gap: 10 (ref 5–15)
BUN: 14 mg/dL (ref 8–23)
CO2: 27 mmol/L (ref 22–32)
Calcium: 9.6 mg/dL (ref 8.9–10.3)
Chloride: 102 mmol/L (ref 98–111)
Creatinine, Ser: 0.71 mg/dL (ref 0.44–1.00)
GFR, Estimated: >60 mL/min (ref >60)
Glucose, Bld: 89 mg/dL (ref 70–99)
Potassium: 4.4 mmol/L (ref 3.5–5.1)
Sodium: 139 mmol/L (ref 135–145)
Total Bilirubin: 0.5 mg/dL (ref 0.3–1.2)
Total Protein: 7.8 g/dL (ref 6.5–8.1)

## 2021-06-01 LAB — CEA: CEA: 5.2 ng/mL — ABNORMAL HIGH (ref 0.0–4.7)

## 2021-06-06 ENCOUNTER — Other Ambulatory Visit: Payer: Self-pay

## 2021-06-06 ENCOUNTER — Inpatient Hospital Stay: Payer: Medicare HMO | Admitting: Oncology

## 2021-06-06 ENCOUNTER — Encounter: Payer: Self-pay | Admitting: Oncology

## 2021-06-06 VITALS — BP 125/82 | HR 86 | Temp 98.1°F | Wt 139.7 lb

## 2021-06-06 DIAGNOSIS — Z7982 Long term (current) use of aspirin: Secondary | ICD-10-CM | POA: Diagnosis not present

## 2021-06-06 DIAGNOSIS — E785 Hyperlipidemia, unspecified: Secondary | ICD-10-CM | POA: Diagnosis not present

## 2021-06-06 DIAGNOSIS — R5383 Other fatigue: Secondary | ICD-10-CM | POA: Diagnosis not present

## 2021-06-06 DIAGNOSIS — R978 Other abnormal tumor markers: Secondary | ICD-10-CM

## 2021-06-06 DIAGNOSIS — F17210 Nicotine dependence, cigarettes, uncomplicated: Secondary | ICD-10-CM | POA: Diagnosis not present

## 2021-06-06 DIAGNOSIS — C182 Malignant neoplasm of ascending colon: Secondary | ICD-10-CM | POA: Diagnosis not present

## 2021-06-06 DIAGNOSIS — Z79899 Other long term (current) drug therapy: Secondary | ICD-10-CM | POA: Diagnosis not present

## 2021-06-06 DIAGNOSIS — Z720 Tobacco use: Secondary | ICD-10-CM

## 2021-06-06 NOTE — Progress Notes (Signed)
Hematology/Oncology follow up  note [facility] [phone] [phone]   Patient Care Team: Adin Hector, MD as PCP - General (Internal Medicine) Clent Jacks, RN as Oncology Nurse Navigator  REFERRING PROVIDER: Adin Hector, MD  CHIEF COMPLAINTS/REASON FOR VISIT:  right colon cancer follow up  HISTORY OF PRESENTING ILLNESS:   Katie Liu is a  70 y.o.  female with PMH listed below was seen in consultation at the request of  Tama High III, MD  for evaluation of right colon cancer  12/21/2020, patient was referred to see [facility] gastroenterology Dr. Tina Griffiths for evaluation of generalized postprandial abdominal pain and unintentional weight loss.  Symptom onset is 2 to 3 months, some nausea without emesis.  20 pounds of unintentional weight loss within the past few months.  Early satiety, only eating one third of her plate. 12/30/2020, CT abdomen/pelvis angiogram was obtained for evaluation of postprandial abdominal pain. There was no significant narrowing of the mesenteric arteries or veins.  Masslike thickening of the wall of the cecum spacious for malignancy 01/03/2021, EGD showed esophageal mucosal.  1cm hiatal hernia Colonoscopy findings includes 7 mm polyp in the rectum, resected and retrieved, nonbleeding internal hemorrhoids. Malignant partially obstructing tumor in the proximal ascending colon.  Tattooed and biopsied.  Patient was referred to establish care with oncology for further evaluation and management. Is any family history of cancer. Current everyday smoker, 2 packs of cigarettes per week..  She denies any change of bowel habits, black or bloody stool. Patient is accompanied by her husband.  02/24/2021 patient underwent right hemicolectomy. Invasive adenocarcinoma with mucinous features 8cm, grade 2, negative LVI or perineural invasion. carcinoma extends into pericolonic connective tissue. Margin is  negative. 0/21 lymph nodes positive.  pT3 pN0, stage II   INTERVAL HISTORY Katie Liu is a 70 y.o. female who has above history reviewed by me today presents for follow up visit for management of stage II right colon cancer Problems and complaints are listed below: She reports feeling well, no black or bloody stool, no abdominal pain weight loss.  She still has fatigue.  She has gained weight.  She smokes a few cigarettes per day.     Review of Systems  Constitutional:  Positive for fatigue. Negative for appetite change, chills, fever and unexpected weight change.  HENT:   Negative for hearing loss and voice change.   Eyes:  Negative for eye problems.  Respiratory:  Negative for chest tightness and cough.   Cardiovascular:  Negative for chest pain.  Gastrointestinal:  Negative for abdominal distention, abdominal pain and blood in stool.  Endocrine: Negative for hot flashes.  Genitourinary:  Negative for difficulty urinating and frequency.   Musculoskeletal:  Negative for arthralgias.  Skin:  Negative for itching and rash.  Neurological:  Negative for extremity weakness.  Hematological:  Negative for adenopathy.  Psychiatric/Behavioral:  Negative for confusion.    MEDICAL HISTORY:  Past Medical History:  Diagnosis Date   Allergy    Arthritis    GERD (gastroesophageal reflux disease)    Hyperlipemia    Vitamin D deficiency     SURGICAL HISTORY: Past Surgical History:  Procedure Laterality Date   ACHILLES TENDON REPAIR Bilateral    CESAREAN SECTION     COLONOSCOPY WITH PROPOFOL N/A 01/03/2021   Procedure: COLONOSCOPY WITH PROPOFOL;  Surgeon: Toledo, Benay Pike, MD;  Location: [facility] ENDOSCOPY;  Service: Gastroenterology;  Laterality: N/A;   ESOPHAGOGASTRODUODENOSCOPY (EGD) WITH  PROPOFOL N/A 01/03/2021   Procedure: ESOPHAGOGASTRODUODENOSCOPY (EGD) WITH PROPOFOL;  Surgeon: Toledo, Benay Pike, MD;  Location: [facility];  Service: Gastroenterology;  Laterality: N/A;    KNEE ARTHROSCOPY W/ ACL RECONSTRUCTION     LAPAROSCOPIC RIGHT HEMI COLECTOMY N/A 02/24/2021   Procedure: LAPAROSCOPIC RIGHT HEMI COLECTOMY;  Surgeon: Ileana Roup, MD;  Location: [facility];  Service: General;  Laterality: N/A;    SOCIAL HISTORY: Social History   Socioeconomic History   Marital status: Married    Spouse name: Not on file   Number of children: Not on file   Years of education: Not on file   Highest education level: Not on file  Occupational History   Not on file  Tobacco Use   Smoking status: Every Day    Packs/day: 0.25    Years: 9.00    Pack years: 2.25    Types: Cigarettes    Last attempt to quit: 01/25/2021    Years since quitting: 0.3   Smokeless tobacco: Never  Vaping Use   Vaping Use: Never used  Substance and Sexual Activity   Alcohol use: Yes    Alcohol/week: 2.0 standard drinks    Types: 2 Glasses of wine per week    Comment: occasional glass of wine.   Drug use: Never   Sexual activity: Not on file  Other Topics Concern   Not on file  Social History Narrative   Not on file   Social Determinants of Health   Financial Resource Strain: Not on file  Food Insecurity: Not on file  Transportation Needs: Not on file  Physical Activity: Not on file  Stress: Not on file  Social Connections: Not on file  Intimate Partner Violence: Not on file    FAMILY HISTORY: Family History  Problem Relation Age of Onset   Cancer Neg Hx     ALLERGIES:  has No Known Allergies.  MEDICATIONS:  Current Outpatient Medications  Medication Sig Dispense Refill   aspirin 81 MG chewable tablet Chew 81 mg by mouth daily.     atorvastatin (LIPITOR) 40 MG tablet Take 40 mg by mouth daily.     cetirizine (ZYRTEC) 10 MG tablet Take 10 mg by mouth daily as needed for allergies.     Cholecalciferol (VITAMIN D3) 25 MCG (1000 UT) CAPS Take 2,000 Units by mouth at bedtime.     ibuprofen (ADVIL,MOTRIN) 200 MG tablet Take 200 mg by mouth daily as needed (Stomach pain).      nicotine (NICODERM CQ - DOSED IN MG/24 HR) 7 mg/24hr patch Place 7 mg onto the skin daily.     NON FORMULARY Place 7 drops under the tongue at bedtime. CBD oil (contains NO "THC"):     Omega-3 1000 MG CAPS Take 1,000 mg by mouth daily.     omeprazole (PRILOSEC) 40 MG capsule Take 40 mg by mouth daily.     Probiotic Product (PHILLIPS COLON HEALTH PO) Take 1 capsule by mouth daily.     Propylene Glycol (SYSTANE BALANCE OP) Place 1-2 drops into both eyes 2 (two) times daily as needed (for dryness or irritation).     sertraline (ZOLOFT) 25 MG tablet Take 25 mg by mouth daily.     No current facility-administered medications for this visit.     PHYSICAL EXAMINATION: ECOG PERFORMANCE STATUS: 1 - Symptomatic but completely ambulatory Vitals:   06/06/21 1309  BP: 125/82  Pulse: 86  Temp: 98.1 F (36.7 C)  SpO2: 98%   Filed Weights  06/06/21 1309  Weight: 139 lb 11.2 oz (63.4 kg)    Physical Exam Constitutional:      General: She is not in acute distress. HENT:     Head: Normocephalic and atraumatic.  Eyes:     General: No scleral icterus. Cardiovascular:     Rate and Rhythm: Normal rate and regular rhythm.     Heart sounds: Normal heart sounds.  Pulmonary:     Effort: Pulmonary effort is normal. No respiratory distress.     Breath sounds: No wheezing.  Abdominal:     General: Bowel sounds are normal. There is no distension.     Palpations: Abdomen is soft.  Musculoskeletal:        General: No deformity. Normal range of motion.     Cervical back: Normal range of motion and neck supple.  Skin:    General: Skin is warm and dry.     Findings: No erythema or rash.  Neurological:     Mental Status: She is alert and oriented to person, place, and time. Mental status is at baseline.     Cranial Nerves: No cranial nerve deficit.     Coordination: Coordination normal.  Psychiatric:        Mood and Affect: Mood normal.    LABORATORY DATA:  I have reviewed the data as  listed Lab Results  Component Value Date   WBC 8.5 05/31/2021   HGB 14.0 05/31/2021   HCT 41.9 05/31/2021   MCV 87.7 05/31/2021   PLT 241 05/31/2021   Recent Labs    01/11/21 1203 02/25/21 0422 02/26/21 0358 05/31/21 1011  NA 144 142 143 139  K 4.3 4.4 3.6 4.4  CL 104 110 108 102  CO2 27 27 28 27   GLUCOSE 87 116* 78 89  BUN 8 8 11 14   CREATININE 0.72 0.66 0.68 0.71  CALCIUM 9.2 8.7* 8.7* 9.6  GFRNONAA >60 >60 >60 >60  PROT 7.7  --   --  7.8  ALBUMIN 3.9  --   --  4.6  AST 19  --   --  25  ALT 14  --   --  26  ALKPHOS 93  --   --  82  BILITOT 0.6  --   --  0.5    Iron/TIBC/Ferritin/ %Sat No results found for: IRON, TIBC, FERRITIN, IRONPCTSAT    RADIOGRAPHIC STUDIES: I have personally reviewed the radiological images as listed and agreed with the findings in the report. No results found.     ASSESSMENT & PLAN:  1. Malignant neoplasm of ascending colon ([facility])   2. Other fatigue   3. Tobacco use   Cancer Staging Malignant neoplasm of ascending colon [facility]) Staging form: Colon and Rectum, AJCC 8th Edition - Pathologic stage from 03/02/2021: Stage IIA (pT3, pN0, cM0) - Signed by Earlie Server, MD on 03/02/2021  #Ascending colon carcinoma with mucinous feature, pT3 pN0 cMx, Stage II disease, no high risk features,  Labs are reviewed and discussed with patient. Persistently elevated CEA despite surgery, I will obtain baseline CT chest abdomen pelvis for follow up.  This could be due to smoking.   Tobacco use, Smoke cessation was discussed with patient.   Anemia has completed resolved.   Fatigue, no anemia. Recommend her to take oral vitamin b12 empirically.    Orders Placed This Encounter  Procedures   CT CHEST ABDOMEN PELVIS W CONTRAST    Standing Status:   Future    Standing Expiration Date:  06/06/2022    Order Specific Question:   Preferred imaging location?    Answer:   [facility]    Order Specific Question:   Is Oral Contrast requested for this  exam?    Answer:   Yes, Per Radiology protocol    Order Specific Question:   Reason for Exam (SYMPTOM  OR DIAGNOSIS REQUIRED)    Answer:   colon cancer followup   Vitamin B12    Standing Status:   Future    Standing Expiration Date:   06/06/2022   CEA    Standing Status:   Future    Standing Expiration Date:   06/06/2022   CBC with Differential    Standing Status:   Future    Standing Expiration Date:   06/06/2022   Comprehensive metabolic panel    Standing Status:   Future    Standing Expiration Date:   06/06/2022    All questions were answered. The patient knows to call the clinic with any problems questions or concerns.  cc Adin Hector, MD    Return of visit:  3 months, lab -cbc cmp  CEA,B12  MD    Earlie Server, MD, PhD Hematology Oncology [place] at [facility]  06/06/2021

## 2021-06-17 DIAGNOSIS — D72829 Elevated white blood cell count, unspecified: Secondary | ICD-10-CM | POA: Diagnosis not present

## 2021-06-17 DIAGNOSIS — E7849 Other hyperlipidemia: Secondary | ICD-10-CM | POA: Diagnosis not present

## 2021-06-17 DIAGNOSIS — E559 Vitamin D deficiency, unspecified: Secondary | ICD-10-CM | POA: Diagnosis not present

## 2021-06-21 ENCOUNTER — Other Ambulatory Visit: Payer: Self-pay

## 2021-06-21 ENCOUNTER — Ambulatory Visit
Admission: RE | Admit: 2021-06-21 | Discharge: 2021-06-21 | Disposition: A | Discharge status: Home / Self Care | Payer: Medicare HMO | Source: Ambulatory Visit | Attending: Oncology | Admitting: Oncology

## 2021-06-21 DIAGNOSIS — I2510 Atherosclerotic heart disease of native coronary artery without angina pectoris: Secondary | ICD-10-CM | POA: Diagnosis not present

## 2021-06-21 DIAGNOSIS — I700 Atherosclerosis of aorta: Secondary | ICD-10-CM | POA: Diagnosis not present

## 2021-06-21 DIAGNOSIS — Z9889 Other specified postprocedural states: Secondary | ICD-10-CM | POA: Diagnosis not present

## 2021-06-21 DIAGNOSIS — C182 Malignant neoplasm of ascending colon: Secondary | ICD-10-CM | POA: Diagnosis not present

## 2021-06-21 DIAGNOSIS — C189 Malignant neoplasm of colon, unspecified: Secondary | ICD-10-CM | POA: Diagnosis not present

## 2021-06-21 MED ORDER — IOHEXOL 300 MG/ML  SOLN
100.0000 mL | Freq: Once | INTRAMUSCULAR | Status: AC | PRN
  Administered 2021-06-21: 100 mL via INTRAVENOUS

## 2021-06-22 DIAGNOSIS — F17210 Nicotine dependence, cigarettes, uncomplicated: Secondary | ICD-10-CM | POA: Diagnosis not present

## 2021-06-22 DIAGNOSIS — E785 Hyperlipidemia, unspecified: Secondary | ICD-10-CM | POA: Diagnosis not present

## 2021-06-22 DIAGNOSIS — Z0000 Encounter for general adult medical examination without abnormal findings: Secondary | ICD-10-CM | POA: Diagnosis not present

## 2021-06-22 DIAGNOSIS — C182 Malignant neoplasm of ascending colon: Secondary | ICD-10-CM | POA: Diagnosis not present

## 2021-06-22 DIAGNOSIS — I700 Atherosclerosis of aorta: Secondary | ICD-10-CM | POA: Diagnosis not present

## 2021-06-22 DIAGNOSIS — E559 Vitamin D deficiency, unspecified: Secondary | ICD-10-CM | POA: Diagnosis not present

## 2021-08-24 ENCOUNTER — Other Ambulatory Visit: Payer: Self-pay | Admitting: Internal Medicine

## 2021-08-24 DIAGNOSIS — Z1231 Encounter for screening mammogram for malignant neoplasm of breast: Secondary | ICD-10-CM

## 2021-09-06 ENCOUNTER — Inpatient Hospital Stay: Payer: Medicare HMO | Attending: Oncology

## 2021-09-06 ENCOUNTER — Other Ambulatory Visit: Payer: Self-pay

## 2021-09-06 DIAGNOSIS — R970 Elevated carcinoembryonic antigen [CEA]: Secondary | ICD-10-CM | POA: Insufficient documentation

## 2021-09-06 DIAGNOSIS — Z85038 Personal history of other malignant neoplasm of large intestine: Secondary | ICD-10-CM | POA: Diagnosis not present

## 2021-09-06 DIAGNOSIS — Z79899 Other long term (current) drug therapy: Secondary | ICD-10-CM | POA: Insufficient documentation

## 2021-09-06 DIAGNOSIS — R5383 Other fatigue: Secondary | ICD-10-CM

## 2021-09-06 DIAGNOSIS — F17210 Nicotine dependence, cigarettes, uncomplicated: Secondary | ICD-10-CM | POA: Insufficient documentation

## 2021-09-06 DIAGNOSIS — C182 Malignant neoplasm of ascending colon: Secondary | ICD-10-CM

## 2021-09-06 DIAGNOSIS — Z7982 Long term (current) use of aspirin: Secondary | ICD-10-CM | POA: Diagnosis not present

## 2021-09-06 LAB — COMPREHENSIVE METABOLIC PANEL
ALT: 18 U/L (ref 0–44)
AST: 19 U/L (ref 15–41)
Albumin: 4.7 g/dL (ref 3.5–5.0)
Alkaline Phosphatase: 80 U/L (ref 38–126)
Anion gap: 8 (ref 5–15)
BUN: 15 mg/dL (ref 8–23)
CO2: 26 mmol/L (ref 22–32)
Calcium: 9.3 mg/dL (ref 8.9–10.3)
Chloride: 106 mmol/L (ref 98–111)
Creatinine, Ser: 0.67 mg/dL (ref 0.44–1.00)
GFR, Estimated: >60 mL/min (ref >60)
Glucose, Bld: 92 mg/dL (ref 70–99)
Potassium: 4 mmol/L (ref 3.5–5.1)
Sodium: 140 mmol/L (ref 135–145)
Total Bilirubin: 0.9 mg/dL (ref 0.3–1.2)
Total Protein: 7.6 g/dL (ref 6.5–8.1)

## 2021-09-06 LAB — CBC WITH DIFFERENTIAL/PLATELET
Abs Immature Granulocytes: 0.01 10*3/uL (ref 0.00–0.07)
Basophils Absolute: 0.1 10*3/uL (ref 0.0–0.1)
Basophils Relative: 1 %
Eosinophils Absolute: 0.1 10*3/uL (ref 0.0–0.5)
Eosinophils Relative: 2 %
HCT: 43.8 % (ref 36.0–46.0)
Hemoglobin: 15 g/dL (ref 12.0–15.0)
Immature Granulocytes: 0 %
Lymphocytes Relative: 39 %
Lymphs Abs: 3 10*3/uL (ref 0.7–4.0)
MCH: 32.1 pg (ref 26.0–34.0)
MCHC: 34.2 g/dL (ref 30.0–36.0)
MCV: 93.6 fL (ref 80.0–100.0)
Monocytes Absolute: 0.5 10*3/uL (ref 0.1–1.0)
Monocytes Relative: 7 %
Neutro Abs: 3.9 10*3/uL (ref 1.7–7.7)
Neutrophils Relative %: 51 %
Platelets: 245 10*3/uL (ref 150–400)
RBC: 4.68 MIL/uL (ref 3.87–5.11)
RDW: 14.5 % (ref 11.5–15.5)
WBC: 7.6 10*3/uL (ref 4.0–10.5)
nRBC: 0 % (ref 0.0–0.2)

## 2021-09-06 LAB — VITAMIN B12: Vitamin B-12: 362 pg/mL (ref 180–914)

## 2021-09-07 ENCOUNTER — Encounter: Payer: Self-pay | Admitting: Oncology

## 2021-09-07 ENCOUNTER — Inpatient Hospital Stay: Payer: Medicare HMO | Admitting: Oncology

## 2021-09-07 VITALS — BP 140/95 | HR 76 | Temp 96.8°F | Resp 18 | Wt 155.3 lb

## 2021-09-07 DIAGNOSIS — Z720 Tobacco use: Secondary | ICD-10-CM

## 2021-09-07 DIAGNOSIS — F17210 Nicotine dependence, cigarettes, uncomplicated: Secondary | ICD-10-CM | POA: Diagnosis not present

## 2021-09-07 DIAGNOSIS — Z7982 Long term (current) use of aspirin: Secondary | ICD-10-CM | POA: Diagnosis not present

## 2021-09-07 DIAGNOSIS — C182 Malignant neoplasm of ascending colon: Secondary | ICD-10-CM

## 2021-09-07 DIAGNOSIS — R970 Elevated carcinoembryonic antigen [CEA]: Secondary | ICD-10-CM | POA: Diagnosis not present

## 2021-09-07 DIAGNOSIS — Z79899 Other long term (current) drug therapy: Secondary | ICD-10-CM | POA: Diagnosis not present

## 2021-09-07 DIAGNOSIS — Z85038 Personal history of other malignant neoplasm of large intestine: Secondary | ICD-10-CM | POA: Diagnosis not present

## 2021-09-07 LAB — CEA: CEA: 6.9 ng/mL — ABNORMAL HIGH (ref 0.0–4.7)

## 2021-09-07 NOTE — Progress Notes (Signed)
Patient here for follow up. No new concerns voiced.  °

## 2021-09-07 NOTE — Progress Notes (Signed)
Hematology/Oncology follow up  note [phone] [phone]   Patient Care Team: Adin Hector, MD as PCP - General (Internal Medicine) Clent Jacks, RN as Oncology Nurse Navigator  REFERRING PROVIDER: Adin Hector, MD  CHIEF COMPLAINTS/REASON FOR VISIT:  right colon cancer follow up  HISTORY OF PRESENTING ILLNESS:   Katie Liu is a  71 y.o.  female with PMH listed below was seen in consultation at the request of  Tama High III, MD  for evaluation of right colon cancer  12/21/2020, patient was referred to see [facility] gastroenterology Dr. Tina Griffiths for evaluation of generalized postprandial abdominal pain and unintentional weight loss.  Symptom onset is 2 to 3 months, some nausea without emesis.  20 pounds of unintentional weight loss within the past few months.  Early satiety, only eating one third of her plate. 12/30/2020, CT abdomen/pelvis angiogram was obtained for evaluation of postprandial abdominal pain. There was no significant narrowing of the mesenteric arteries or veins.  Masslike thickening of the wall of the cecum spacious for malignancy 01/03/2021, EGD showed esophageal mucosal.  1cm hiatal hernia Colonoscopy findings includes 7 mm polyp in the rectum, resected and retrieved, nonbleeding internal hemorrhoids. Malignant partially obstructing tumor in the proximal ascending colon.  Tattooed and biopsied.  Patient was referred to establish care with oncology for further evaluation and management. Is any family history of cancer. Current everyday smoker, 2 packs of cigarettes per week..  She denies any change of bowel habits, black or bloody stool. Patient is accompanied by her husband.  02/24/2021 patient underwent right hemicolectomy. Invasive adenocarcinoma with mucinous features 8cm, grade 2, negative LVI or perineural invasion. carcinoma extends into pericolonic connective tissue. Margin is negative. 0/21 lymph nodes  positive.  pT3 pN0, stage II   INTERVAL HISTORY Katie Liu is a 71 y.o. female who has above history reviewed by me today presents for follow up visit for management of stage II right colon cancer Problems and complaints are listed below: Patient reports feeling well.  Denies any change of bowel habits, black or bloody stool.  Denies abdominal pain, unintentional weight loss. Patient has gained weight. Currently patient smokes about 10 to 12 cigarettes/day.     Review of Systems  Constitutional:  Positive for fatigue. Negative for appetite change, chills, fever and unexpected weight change.  HENT:   Negative for hearing loss and voice change.   Eyes:  Negative for eye problems.  Respiratory:  Negative for chest tightness and cough.   Cardiovascular:  Negative for chest pain.  Gastrointestinal:  Negative for abdominal distention, abdominal pain and blood in stool.  Endocrine: Negative for hot flashes.  Genitourinary:  Negative for difficulty urinating and frequency.   Musculoskeletal:  Negative for arthralgias.  Skin:  Negative for itching and rash.  Neurological:  Negative for extremity weakness.  Hematological:  Negative for adenopathy.  Psychiatric/Behavioral:  Negative for confusion.    MEDICAL HISTORY:  Past Medical History:  Diagnosis Date   Allergy    Arthritis    GERD (gastroesophageal reflux disease)    Hyperlipemia    Vitamin D deficiency     SURGICAL HISTORY: Past Surgical History:  Procedure Laterality Date   ACHILLES TENDON REPAIR Bilateral    CESAREAN SECTION     COLONOSCOPY WITH PROPOFOL N/A 01/03/2021   Procedure: COLONOSCOPY WITH PROPOFOL;  Surgeon: Toledo, Benay Pike, MD;  Location: [facility];  Service: Gastroenterology;  Laterality: N/A;   ESOPHAGOGASTRODUODENOSCOPY (EGD) WITH PROPOFOL N/A  01/03/2021   Procedure: ESOPHAGOGASTRODUODENOSCOPY (EGD) WITH PROPOFOL;  Surgeon: Toledo, Benay Pike, MD;  Location: [facility];  Service:  Gastroenterology;  Laterality: N/A;   KNEE ARTHROSCOPY W/ ACL RECONSTRUCTION     LAPAROSCOPIC RIGHT HEMI COLECTOMY N/A 02/24/2021   Procedure: LAPAROSCOPIC RIGHT HEMI COLECTOMY;  Surgeon: Ileana Roup, MD;  Location: [facility];  Service: General;  Laterality: N/A;    SOCIAL HISTORY: Social History   Socioeconomic History   Marital status: Married    Spouse name: Not on file   Number of children: Not on file   Years of education: Not on file   Highest education level: Not on file  Occupational History   Not on file  Tobacco Use   Smoking status: Every Day    Packs/day: 0.25    Years: 9.00    Pack years: 2.25    Types: Cigarettes    Last attempt to quit: 01/25/2021    Years since quitting: 0.6   Smokeless tobacco: Never  Vaping Use   Vaping Use: Never used  Substance and Sexual Activity   Alcohol use: Yes    Alcohol/week: 2.0 standard drinks    Types: 2 Glasses of wine per week    Comment: occasional glass of wine.   Drug use: Never   Sexual activity: Not on file  Other Topics Concern   Not on file  Social History Narrative   Not on file   Social Determinants of Health   Financial Resource Strain: Not on file  Food Insecurity: Not on file  Transportation Needs: Not on file  Physical Activity: Not on file  Stress: Not on file  Social Connections: Not on file  Intimate Partner Violence: Not on file    FAMILY HISTORY: Family History  Problem Relation Age of Onset   Cancer Neg Hx     ALLERGIES:  has No Known Allergies.  MEDICATIONS:  Current Outpatient Medications  Medication Sig Dispense Refill   aspirin 81 MG chewable tablet Chew 81 mg by mouth daily.     atorvastatin (LIPITOR) 40 MG tablet Take 40 mg by mouth daily.     cetirizine (ZYRTEC) 10 MG tablet Take 10 mg by mouth daily as needed for allergies.     Cholecalciferol (VITAMIN D3) 25 MCG (1000 UT) CAPS Take 2,000 Units by mouth at bedtime.     ibuprofen (ADVIL,MOTRIN) 200 MG tablet Take 200 mg by  mouth daily as needed (Stomach pain).     nicotine (NICODERM CQ - DOSED IN MG/24 HR) 7 mg/24hr patch Place 7 mg onto the skin daily.     NON FORMULARY Place 7 drops under the tongue at bedtime. CBD oil (contains NO "THC"):     Omega-3 1000 MG CAPS Take 1,000 mg by mouth daily.     omeprazole (PRILOSEC) 40 MG capsule Take 40 mg by mouth daily.     Probiotic Product (PHILLIPS COLON HEALTH PO) Take 1 capsule by mouth daily.     Propylene Glycol (SYSTANE BALANCE OP) Place 1-2 drops into both eyes 2 (two) times daily as needed (for dryness or irritation).     No current facility-administered medications for this visit.     PHYSICAL EXAMINATION: ECOG PERFORMANCE STATUS: 1 - Symptomatic but completely ambulatory Vitals:   09/07/21 0919  BP: (!) 140/95  Pulse: 76  Resp: 18  Temp: (!) 96.8 F (36 C)   Filed Weights   09/07/21 0919  Weight: 155 lb 4.8 oz (70.4 kg)    Physical Exam  Constitutional:      General: She is not in acute distress. HENT:     Head: Normocephalic and atraumatic.  Eyes:     General: No scleral icterus. Cardiovascular:     Rate and Rhythm: Normal rate and regular rhythm.     Heart sounds: Normal heart sounds.  Pulmonary:     Effort: Pulmonary effort is normal. No respiratory distress.     Breath sounds: No wheezing.  Abdominal:     General: Bowel sounds are normal. There is no distension.     Palpations: Abdomen is soft.  Musculoskeletal:        General: No deformity. Normal range of motion.     Cervical back: Normal range of motion and neck supple.  Skin:    General: Skin is warm and dry.     Findings: No erythema or rash.  Neurological:     Mental Status: She is alert and oriented to person, place, and time. Mental status is at baseline.     Cranial Nerves: No cranial nerve deficit.     Coordination: Coordination normal.  Psychiatric:        Mood and Affect: Mood normal.    LABORATORY DATA:  I have reviewed the data as listed Lab Results   Component Value Date   WBC 7.6 09/06/2021   HGB 15.0 09/06/2021   HCT 43.8 09/06/2021   MCV 93.6 09/06/2021   PLT 245 09/06/2021   Recent Labs    01/11/21 1203 02/25/21 0422 02/26/21 0358 05/31/21 1011 09/06/21 0852  NA 144   < > 143 139 140  K 4.3   < > 3.6 4.4 4.0  CL 104   < > 108 102 106  CO2 27   < > 28 27 26   GLUCOSE 87   < > 78 89 92  BUN 8   < > 11 14 15   CREATININE 0.72   < > 0.68 0.71 0.67  CALCIUM 9.2   < > 8.7* 9.6 9.3  GFRNONAA >60   < > >60 >60 >60  PROT 7.7  --   --  7.8 7.6  ALBUMIN 3.9  --   --  4.6 4.7  AST 19  --   --  25 19  ALT 14  --   --  26 18  ALKPHOS 93  --   --  82 80  BILITOT 0.6  --   --  0.5 0.9   < > = values in this interval not displayed.    Iron/TIBC/Ferritin/ %Sat No results found for: IRON, TIBC, FERRITIN, IRONPCTSAT    RADIOGRAPHIC STUDIES: I have personally reviewed the radiological images as listed and agreed with the findings in the report. No results found.     ASSESSMENT & PLAN:  1. Malignant neoplasm of ascending colon ([address])   2. Tobacco use   3. Elevated CEA    Cancer Staging  Malignant neoplasm of ascending colon [facility]) Staging form: Colon and Rectum, AJCC 8th Edition - Pathologic stage from 03/02/2021: Stage IIA (pT3, pN0, cM0) - Signed by Earlie Server, MD on 03/02/2021  #Ascending colon carcinoma with mucinous feature, pT3 pN0 cMx, Stage II disease, no high risk features,  Labs are reviewed and discussed with patient Patient has persistently elevated CEA despite surgery  06/21/2021 CT chest abdomen pelvis showed no signs of recurrence or metastatic disease. CEA continues to trend up to 6.9.  This could be secondary to smoking. I will repeat CEA in 4 weeks.  Tobacco use, smoke cessation was discussed with patient.  She is motivated  Follow-up  CT chest abdomen pelvis in 3 months.  Lab MD to review CT results in 3 months. Orders Placed This Encounter  Procedures   CT CHEST ABDOMEN PELVIS W CONTRAST    Standing  Status:   Future    Standing Expiration Date:   09/07/2022    Order Specific Question:   Preferred imaging location?    Answer[facility]    Order Specific Question:   Is Oral Contrast requested for this exam?    Answer:   Yes, Per Radiology protocol   CEA    Standing Status:   Future    Standing Expiration Date:   09/07/2022   CEA    Standing Status:   Future    Standing Expiration Date:   09/07/2022   CBC with Differential/Platelet    Standing Status:   Future    Standing Expiration Date:   09/07/2022   Comprehensive metabolic panel    Standing Status:   Future    Standing Expiration Date:   09/07/2022    All questions were answered. The patient knows to call the clinic with any problems questions or concerns.  cc Adin Hector, MD      Earlie Server, MD, PhD Hematology Oncology  09/07/2021

## 2021-09-08 ENCOUNTER — Other Ambulatory Visit: Payer: Self-pay

## 2021-09-08 ENCOUNTER — Ambulatory Visit
Admission: RE | Admit: 2021-09-08 | Discharge: 2021-09-08 | Disposition: A | Discharge status: Home / Self Care | Payer: Medicare HMO | Source: Ambulatory Visit | Attending: Internal Medicine | Admitting: Internal Medicine

## 2021-09-08 DIAGNOSIS — Z1231 Encounter for screening mammogram for malignant neoplasm of breast: Secondary | ICD-10-CM

## 2021-09-08 DIAGNOSIS — Z0100 Encounter for examination of eyes and vision without abnormal findings: Secondary | ICD-10-CM | POA: Diagnosis not present

## 2021-09-08 DIAGNOSIS — H524 Presbyopia: Secondary | ICD-10-CM | POA: Diagnosis not present

## 2021-10-05 ENCOUNTER — Other Ambulatory Visit: Payer: Self-pay

## 2021-10-05 ENCOUNTER — Inpatient Hospital Stay: Payer: Medicare HMO | Attending: Oncology

## 2021-10-05 DIAGNOSIS — R970 Elevated carcinoembryonic antigen [CEA]: Secondary | ICD-10-CM | POA: Insufficient documentation

## 2021-10-05 DIAGNOSIS — Z85038 Personal history of other malignant neoplasm of large intestine: Secondary | ICD-10-CM | POA: Insufficient documentation

## 2021-10-05 DIAGNOSIS — C182 Malignant neoplasm of ascending colon: Secondary | ICD-10-CM

## 2021-10-06 LAB — CEA: CEA: 6.9 ng/mL — ABNORMAL HIGH (ref 0.0–4.7)

## 2021-12-05 ENCOUNTER — Other Ambulatory Visit: Payer: Self-pay

## 2021-12-05 ENCOUNTER — Ambulatory Visit
Admission: RE | Admit: 2021-12-05 | Discharge: 2021-12-05 | Disposition: A | Discharge status: Home / Self Care | Payer: Medicare HMO | Source: Ambulatory Visit | Attending: Oncology | Admitting: Oncology

## 2021-12-05 DIAGNOSIS — C801 Malignant (primary) neoplasm, unspecified: Secondary | ICD-10-CM | POA: Diagnosis not present

## 2021-12-05 DIAGNOSIS — I2510 Atherosclerotic heart disease of native coronary artery without angina pectoris: Secondary | ICD-10-CM | POA: Diagnosis not present

## 2021-12-05 DIAGNOSIS — C182 Malignant neoplasm of ascending colon: Secondary | ICD-10-CM | POA: Diagnosis not present

## 2021-12-05 DIAGNOSIS — I700 Atherosclerosis of aorta: Secondary | ICD-10-CM | POA: Diagnosis not present

## 2021-12-05 LAB — POCT I-STAT CREATININE: Creatinine, Ser: 0.7 mg/dL (ref 0.44–1.00)

## 2021-12-05 MED ORDER — IOHEXOL 300 MG/ML  SOLN
100.0000 mL | Freq: Once | INTRAMUSCULAR | Status: AC | PRN
  Administered 2021-12-05: 100 mL via INTRAVENOUS

## 2021-12-23 DIAGNOSIS — E7849 Other hyperlipidemia: Secondary | ICD-10-CM | POA: Diagnosis not present

## 2021-12-23 DIAGNOSIS — D72829 Elevated white blood cell count, unspecified: Secondary | ICD-10-CM | POA: Diagnosis not present

## 2021-12-23 DIAGNOSIS — I700 Atherosclerosis of aorta: Secondary | ICD-10-CM | POA: Diagnosis not present

## 2021-12-26 DIAGNOSIS — E785 Hyperlipidemia, unspecified: Secondary | ICD-10-CM | POA: Diagnosis not present

## 2021-12-26 DIAGNOSIS — E559 Vitamin D deficiency, unspecified: Secondary | ICD-10-CM | POA: Diagnosis not present

## 2021-12-26 DIAGNOSIS — C182 Malignant neoplasm of ascending colon: Secondary | ICD-10-CM | POA: Diagnosis not present

## 2021-12-26 DIAGNOSIS — I700 Atherosclerosis of aorta: Secondary | ICD-10-CM | POA: Diagnosis not present

## 2022-01-03 ENCOUNTER — Inpatient Hospital Stay: Payer: Medicare HMO | Attending: Oncology

## 2022-01-03 DIAGNOSIS — Z85038 Personal history of other malignant neoplasm of large intestine: Secondary | ICD-10-CM | POA: Insufficient documentation

## 2022-01-03 DIAGNOSIS — C182 Malignant neoplasm of ascending colon: Secondary | ICD-10-CM

## 2022-01-03 DIAGNOSIS — Z79899 Other long term (current) drug therapy: Secondary | ICD-10-CM | POA: Diagnosis not present

## 2022-01-03 DIAGNOSIS — F17210 Nicotine dependence, cigarettes, uncomplicated: Secondary | ICD-10-CM | POA: Insufficient documentation

## 2022-01-03 DIAGNOSIS — Z7982 Long term (current) use of aspirin: Secondary | ICD-10-CM | POA: Diagnosis not present

## 2022-01-03 LAB — CBC WITH DIFFERENTIAL/PLATELET
Abs Immature Granulocytes: 0.01 10*3/uL (ref 0.00–0.07)
Basophils Absolute: 0.1 10*3/uL (ref 0.0–0.1)
Basophils Relative: 1 %
Eosinophils Absolute: 0.2 10*3/uL (ref 0.0–0.5)
Eosinophils Relative: 2 %
HCT: 43.3 % (ref 36.0–46.0)
Hemoglobin: 14.8 g/dL (ref 12.0–15.0)
Immature Granulocytes: 0 %
Lymphocytes Relative: 31 %
Lymphs Abs: 2.9 10*3/uL (ref 0.7–4.0)
MCH: 31.8 pg (ref 26.0–34.0)
MCHC: 34.2 g/dL (ref 30.0–36.0)
MCV: 93.1 fL (ref 80.0–100.0)
Monocytes Absolute: 0.5 10*3/uL (ref 0.1–1.0)
Monocytes Relative: 6 %
Neutro Abs: 5.6 10*3/uL (ref 1.7–7.7)
Neutrophils Relative %: 60 %
Platelets: 248 10*3/uL (ref 150–400)
RBC: 4.65 MIL/uL (ref 3.87–5.11)
RDW: 13.2 % (ref 11.5–15.5)
WBC: 9.2 10*3/uL (ref 4.0–10.5)
nRBC: 0 % (ref 0.0–0.2)

## 2022-01-03 LAB — COMPREHENSIVE METABOLIC PANEL
ALT: 22 U/L (ref 0–44)
AST: 24 U/L (ref 15–41)
Albumin: 4.3 g/dL (ref 3.5–5.0)
Alkaline Phosphatase: 79 U/L (ref 38–126)
Anion gap: 11 (ref 5–15)
BUN: 14 mg/dL (ref 8–23)
CO2: 23 mmol/L (ref 22–32)
Calcium: 9.6 mg/dL (ref 8.9–10.3)
Chloride: 107 mmol/L (ref 98–111)
Creatinine, Ser: 0.59 mg/dL (ref 0.44–1.00)
GFR, Estimated: >60 mL/min (ref >60)
Glucose, Bld: 125 mg/dL — ABNORMAL HIGH (ref 70–99)
Potassium: 3.7 mmol/L (ref 3.5–5.1)
Sodium: 141 mmol/L (ref 135–145)
Total Bilirubin: 0.8 mg/dL (ref 0.3–1.2)
Total Protein: 7.5 g/dL (ref 6.5–8.1)

## 2022-01-04 LAB — CEA: CEA: 4.3 ng/mL (ref 0.0–4.7)

## 2022-01-05 ENCOUNTER — Inpatient Hospital Stay: Payer: Medicare HMO | Admitting: Oncology

## 2022-01-05 ENCOUNTER — Encounter: Payer: Self-pay | Admitting: Oncology

## 2022-01-05 VITALS — BP 135/93 | HR 89 | Temp 97.3°F | Wt 168.0 lb

## 2022-01-05 DIAGNOSIS — Z7982 Long term (current) use of aspirin: Secondary | ICD-10-CM | POA: Diagnosis not present

## 2022-01-05 DIAGNOSIS — C182 Malignant neoplasm of ascending colon: Secondary | ICD-10-CM | POA: Diagnosis not present

## 2022-01-05 DIAGNOSIS — Z85038 Personal history of other malignant neoplasm of large intestine: Secondary | ICD-10-CM | POA: Diagnosis not present

## 2022-01-05 DIAGNOSIS — Z720 Tobacco use: Secondary | ICD-10-CM

## 2022-01-05 DIAGNOSIS — F17210 Nicotine dependence, cigarettes, uncomplicated: Secondary | ICD-10-CM | POA: Diagnosis not present

## 2022-01-05 DIAGNOSIS — Z79899 Other long term (current) drug therapy: Secondary | ICD-10-CM | POA: Diagnosis not present

## 2022-01-05 NOTE — Progress Notes (Signed)
Hematology/Oncology Progress note [phone] [phone]       Patient Care Team: Adin Hector, MD as PCP - General (Internal Medicine) Clent Jacks, RN as Oncology Nurse Navigator  REFERRING PROVIDER: Adin Hector, MD  CHIEF COMPLAINTS/REASON FOR VISIT:  right colon cancer follow up  HISTORY OF PRESENTING ILLNESS:   Katie Liu is a  71 y.o.  female with PMH listed below was seen in consultation at the request of  Tama High III, MD  for evaluation of right colon cancer  12/21/2020, patient was referred to see [facility] gastroenterology Dr. Tina Griffiths for evaluation of generalized postprandial abdominal pain and unintentional weight loss.  Symptom onset is 2 to 3 months, some nausea without emesis.  20 pounds of unintentional weight loss within the past few months.  Early satiety, only eating one third of her plate. 12/30/2020, CT abdomen/pelvis angiogram was obtained for evaluation of postprandial abdominal pain. There was no significant narrowing of the mesenteric arteries or veins.  Masslike thickening of the wall of the cecum spacious for malignancy 01/03/2021, EGD showed esophageal mucosal.  1cm hiatal hernia Colonoscopy findings includes 7 mm polyp in the rectum, resected and retrieved, nonbleeding internal hemorrhoids. Malignant partially obstructing tumor in the proximal ascending colon.  Tattooed and biopsied.  Patient was referred to establish care with oncology for further evaluation and management. Is any family history of cancer. Current everyday smoker, 2 packs of cigarettes per week..  She denies any change of bowel habits, black or bloody stool. Patient is accompanied by her husband.  02/24/2021 patient underwent right hemicolectomy. Invasive adenocarcinoma with mucinous features 8cm, grade 2, negative LVI or perineural invasion. carcinoma extends into pericolonic connective tissue. Margin is negative. 0/21 lymph nodes  positive.  pT3 pN0, stage II   06/21/2021 CT chest abdomen pelvis showed no signs of recurrence or metastatic disease.  INTERVAL HISTORY Katie Liu is a 71 y.o. female who has above history reviewed by me today presents for follow up visit for management of stage II right colon cancer Patient currently smokes 2 to 3 cigarettes/day. She reports doing very well Denies any unintentional weight loss, blood in the stool She has gained weight.  No new complaints.    Review of Systems  Constitutional:  Negative for appetite change, chills, fatigue, fever and unexpected weight change.  HENT:   Negative for hearing loss and voice change.   Eyes:  Negative for eye problems.  Respiratory:  Negative for chest tightness and cough.   Cardiovascular:  Negative for chest pain.  Gastrointestinal:  Negative for abdominal distention, abdominal pain and blood in stool.  Endocrine: Negative for hot flashes.  Genitourinary:  Negative for difficulty urinating and frequency.   Musculoskeletal:  Negative for arthralgias.  Skin:  Negative for itching and rash.  Neurological:  Negative for extremity weakness.  Hematological:  Negative for adenopathy.  Psychiatric/Behavioral:  Negative for confusion.    MEDICAL HISTORY:  Past Medical History:  Diagnosis Date   Allergy    Arthritis    GERD (gastroesophageal reflux disease)    Hyperlipemia    Vitamin D deficiency     SURGICAL HISTORY: Past Surgical History:  Procedure Laterality Date   ACHILLES TENDON REPAIR Bilateral    CESAREAN SECTION     COLONOSCOPY WITH PROPOFOL N/A 01/03/2021   Procedure: COLONOSCOPY WITH PROPOFOL;  Surgeon: Toledo, Benay Pike, MD;  Location: [facility];  Service: Gastroenterology;  Laterality: N/A;   ESOPHAGOGASTRODUODENOSCOPY (EGD) WITH  PROPOFOL N/A 01/03/2021   Procedure: ESOPHAGOGASTRODUODENOSCOPY (EGD) WITH PROPOFOL;  Surgeon: Toledo, Benay Pike, MD;  Location: [facility];  Service: Gastroenterology;   Laterality: N/A;   KNEE ARTHROSCOPY W/ ACL RECONSTRUCTION     LAPAROSCOPIC RIGHT HEMI COLECTOMY N/A 02/24/2021   Procedure: LAPAROSCOPIC RIGHT HEMI COLECTOMY;  Surgeon: Ileana Roup, MD;  Location: [facility];  Service: General;  Laterality: N/A;    SOCIAL HISTORY: Social History   Socioeconomic History   Marital status: Married    Spouse name: Not on file   Number of children: Not on file   Years of education: Not on file   Highest education level: Not on file  Occupational History   Not on file  Tobacco Use   Smoking status: Every Day    Packs/day: 0.25    Years: 9.00    Pack years: 2.25    Types: Cigarettes    Last attempt to quit: 01/25/2021    Years since quitting: 0.9   Smokeless tobacco: Never  Vaping Use   Vaping Use: Never used  Substance and Sexual Activity   Alcohol use: Yes    Alcohol/week: 2.0 standard drinks    Types: 2 Glasses of wine per week    Comment: occasional glass of wine.   Drug use: Never   Sexual activity: Not on file  Other Topics Concern   Not on file  Social History Narrative   Not on file   Social Determinants of Health   Financial Resource Strain: Not on file  Food Insecurity: Not on file  Transportation Needs: Not on file  Physical Activity: Not on file  Stress: Not on file  Social Connections: Not on file  Intimate Partner Violence: Not on file    FAMILY HISTORY: Family History  Problem Relation Age of Onset   Cancer Neg Hx     ALLERGIES:  has No Known Allergies.  MEDICATIONS:  Current Outpatient Medications  Medication Sig Dispense Refill   aspirin 81 MG chewable tablet Chew 81 mg by mouth daily.     atorvastatin (LIPITOR) 40 MG tablet Take 40 mg by mouth daily.     cetirizine (ZYRTEC) 10 MG tablet Take 10 mg by mouth daily as needed for allergies.     Cholecalciferol (VITAMIN D3) 25 MCG (1000 UT) CAPS Take 2,000 Units by mouth at bedtime.     Coenzyme Q10 (COQ-10) 100 MG CAPS      nicotine (NICODERM CQ - DOSED IN  MG/24 HR) 7 mg/24hr patch Place 7 mg onto the skin daily.     NON FORMULARY Place 7 drops under the tongue at bedtime. CBD oil (contains NO "THC"):     Omega-3 1000 MG CAPS Take 1,000 mg by mouth daily.     Propylene Glycol (SYSTANE BALANCE OP) Place 1-2 drops into both eyes 2 (two) times daily as needed (for dryness or irritation).     ibuprofen (ADVIL,MOTRIN) 200 MG tablet Take 200 mg by mouth daily as needed (Stomach pain).     omeprazole (PRILOSEC) 40 MG capsule Take 40 mg by mouth daily.     Probiotic Product (PHILLIPS COLON HEALTH PO) Take 1 capsule by mouth daily.     No current facility-administered medications for this visit.     PHYSICAL EXAMINATION: ECOG PERFORMANCE STATUS: 1 - Symptomatic but completely ambulatory Vitals:   01/05/22 0947  BP: (!) 135/93  Pulse: 89  Temp: (!) 97.3 F (36.3 C)   Filed Weights   01/05/22 0947  Weight: 168  lb (76.2 kg)    Physical Exam Constitutional:      General: She is not in acute distress. HENT:     Head: Normocephalic and atraumatic.  Eyes:     General: No scleral icterus. Cardiovascular:     Rate and Rhythm: Normal rate and regular rhythm.     Heart sounds: Normal heart sounds.  Pulmonary:     Effort: Pulmonary effort is normal. No respiratory distress.     Breath sounds: No wheezing.  Abdominal:     General: Bowel sounds are normal. There is no distension.     Palpations: Abdomen is soft.  Musculoskeletal:        General: No deformity. Normal range of motion.     Cervical back: Normal range of motion and neck supple.  Skin:    General: Skin is warm and dry.     Findings: No erythema or rash.  Neurological:     Mental Status: She is alert and oriented to person, place, and time. Mental status is at baseline.     Cranial Nerves: No cranial nerve deficit.     Coordination: Coordination normal.  Psychiatric:        Mood and Affect: Mood normal.    LABORATORY DATA:  I have reviewed the data as listed Lab Results   Component Value Date   WBC 9.2 01/03/2022   HGB 14.8 01/03/2022   HCT 43.3 01/03/2022   MCV 93.1 01/03/2022   PLT 248 01/03/2022   Recent Labs    05/31/21 1011 09/06/21 0852 12/05/21 1038 01/03/22 0910  NA 139 140  --  141  K 4.4 4.0  --  3.7  CL 102 106  --  107  CO2 27 26  --  23  GLUCOSE 89 92  --  125*  BUN 14 15  --  14  CREATININE 0.71 0.67 0.70 0.59  CALCIUM 9.6 9.3  --  9.6  GFRNONAA >60 >60  --  >60  PROT 7.8 7.6  --  7.5  ALBUMIN 4.6 4.7  --  4.3  AST 25 19  --  24  ALT 26 18  --  22  ALKPHOS 82 80  --  79  BILITOT 0.5 0.9  --  0.8    Iron/TIBC/Ferritin/ %Sat No results found for: IRON, TIBC, FERRITIN, IRONPCTSAT    RADIOGRAPHIC STUDIES: I have personally reviewed the radiological images as listed and agreed with the findings in the report. No results found.     ASSESSMENT & PLAN:  1. Malignant neoplasm of ascending colon ([place])   2. Tobacco use    Cancer Staging  Malignant neoplasm of ascending colon [facility]) Staging form: Colon and Rectum, AJCC 8th Edition - Pathologic stage from 03/02/2021: Stage IIA (pT3, pN0, cM0) - Signed by Earlie Server, MD on 03/02/2021  #Ascending colon carcinoma with mucinous feature, pT3 pN0 cMx, Stage II disease, no high risk features,  Labs reviewed and discussed with patient. CEA has normalized Patient is doing very well clinically 12/05/2021, CT chest abdomen pelvis with contrast showed no evidence of recurrence or metastatic disease in the chest, abdomen or pelvis.  Aortic atherosclerosis. Continue surveillance.  Repeat CT in 6 months Recommend patient to get repeat colonoscopy done in July 2023-1 year after surgery.  Discussed case with [facility] team.   Tobacco use, I encouraged her smoke cessation efforts.  Previous elevated CEA could be secondary to smoking. Follow-up  CT chest abdomen pelvis in 6 months.  Lab MD  to review CT results in 6 months. Orders Placed This Encounter  Procedures   CT CHEST ABDOMEN PELVIS W  CONTRAST    Standing Status:   Future    Standing Expiration Date:   01/06/2023    Order Specific Question:   Preferred imaging location?    Answer:   [facility]    Order Specific Question:   Is Oral Contrast requested for this exam?    Answer:   Yes, Per Radiology protocol   CBC with Differential/Platelet    Standing Status:   Future    Standing Expiration Date:   01/06/2023   Comprehensive metabolic panel    Standing Status:   Future    Standing Expiration Date:   01/06/2023   CEA    Standing Status:   Future    Standing Expiration Date:   01/06/2023    All questions were answered. The patient knows to call the clinic with any problems questions or concerns.  cc Adin Hector, MD      Earlie Server, MD, PhD Hematology Oncology  01/05/2022

## 2022-01-25 DIAGNOSIS — C182 Malignant neoplasm of ascending colon: Secondary | ICD-10-CM | POA: Diagnosis not present

## 2022-01-25 DIAGNOSIS — M25561 Pain in right knee: Secondary | ICD-10-CM | POA: Diagnosis not present

## 2022-01-25 DIAGNOSIS — I700 Atherosclerosis of aorta: Secondary | ICD-10-CM | POA: Diagnosis not present

## 2022-01-25 DIAGNOSIS — E785 Hyperlipidemia, unspecified: Secondary | ICD-10-CM | POA: Diagnosis not present

## 2022-02-07 DIAGNOSIS — Z1211 Encounter for screening for malignant neoplasm of colon: Secondary | ICD-10-CM | POA: Diagnosis not present

## 2022-02-07 DIAGNOSIS — D128 Benign neoplasm of rectum: Secondary | ICD-10-CM | POA: Diagnosis not present

## 2022-02-07 DIAGNOSIS — K640 First degree hemorrhoids: Secondary | ICD-10-CM | POA: Diagnosis not present

## 2022-02-07 DIAGNOSIS — Z980 Intestinal bypass and anastomosis status: Secondary | ICD-10-CM | POA: Diagnosis not present

## 2022-02-07 DIAGNOSIS — Z09 Encounter for follow-up examination after completed treatment for conditions other than malignant neoplasm: Secondary | ICD-10-CM | POA: Diagnosis not present

## 2022-02-07 DIAGNOSIS — Z08 Encounter for follow-up examination after completed treatment for malignant neoplasm: Secondary | ICD-10-CM | POA: Diagnosis not present

## 2022-02-07 DIAGNOSIS — K621 Rectal polyp: Secondary | ICD-10-CM | POA: Diagnosis not present

## 2022-02-07 DIAGNOSIS — Z85038 Personal history of other malignant neoplasm of large intestine: Secondary | ICD-10-CM | POA: Diagnosis not present

## 2022-03-20 DIAGNOSIS — M2391 Unspecified internal derangement of right knee: Secondary | ICD-10-CM | POA: Diagnosis not present

## 2022-03-20 DIAGNOSIS — M25561 Pain in right knee: Secondary | ICD-10-CM | POA: Diagnosis not present

## 2022-03-20 DIAGNOSIS — M1731 Unilateral post-traumatic osteoarthritis, right knee: Secondary | ICD-10-CM | POA: Diagnosis not present

## 2022-03-20 DIAGNOSIS — G8929 Other chronic pain: Secondary | ICD-10-CM | POA: Diagnosis not present

## 2022-03-21 DIAGNOSIS — D2272 Melanocytic nevi of left lower limb, including hip: Secondary | ICD-10-CM | POA: Diagnosis not present

## 2022-03-21 DIAGNOSIS — B078 Other viral warts: Secondary | ICD-10-CM | POA: Diagnosis not present

## 2022-03-21 DIAGNOSIS — L821 Other seborrheic keratosis: Secondary | ICD-10-CM | POA: Diagnosis not present

## 2022-03-21 DIAGNOSIS — D2261 Melanocytic nevi of right upper limb, including shoulder: Secondary | ICD-10-CM | POA: Diagnosis not present

## 2022-03-21 DIAGNOSIS — D2271 Melanocytic nevi of right lower limb, including hip: Secondary | ICD-10-CM | POA: Diagnosis not present

## 2022-03-21 DIAGNOSIS — R58 Hemorrhage, not elsewhere classified: Secondary | ICD-10-CM | POA: Diagnosis not present

## 2022-03-21 DIAGNOSIS — D2262 Melanocytic nevi of left upper limb, including shoulder: Secondary | ICD-10-CM | POA: Diagnosis not present

## 2022-03-21 DIAGNOSIS — D225 Melanocytic nevi of trunk: Secondary | ICD-10-CM | POA: Diagnosis not present

## 2022-03-21 DIAGNOSIS — R208 Other disturbances of skin sensation: Secondary | ICD-10-CM | POA: Diagnosis not present

## 2022-03-28 ENCOUNTER — Other Ambulatory Visit: Payer: Self-pay | Admitting: Physician Assistant

## 2022-03-28 DIAGNOSIS — M23203 Derangement of unspecified medial meniscus due to old tear or injury, right knee: Secondary | ICD-10-CM

## 2022-04-06 ENCOUNTER — Ambulatory Visit
Admission: RE | Admit: 2022-04-06 | Discharge: 2022-04-06 | Disposition: A | Discharge status: Home / Self Care | Payer: Medicare HMO | Source: Ambulatory Visit | Attending: Physician Assistant | Admitting: Physician Assistant

## 2022-04-06 DIAGNOSIS — M23203 Derangement of unspecified medial meniscus due to old tear or injury, right knee: Secondary | ICD-10-CM | POA: Insufficient documentation

## 2022-04-06 DIAGNOSIS — M1711 Unilateral primary osteoarthritis, right knee: Secondary | ICD-10-CM | POA: Diagnosis not present

## 2022-04-28 IMAGING — CT CT CTA ABD/PEL W/CM AND/OR W/O CM
3 of 4 series · 11 of 36 positions shown, 17 images · IV contrast (omnipaque)
Comparison: None.

CLINICAL DATA: 20 pound weight loss within 6 weeks

Right mid and lower quadrant abdominal pain and tenderness
EXAM:
CTA ABDOMEN AND PELVIS WITHOUT AND WITH CONTRAST
TECHNIQUE: Multidetector CT imaging of the abdomen and pelvis was performed
using the standard protocol during bolus administration of
intravenous contrast. Multiplanar reconstructed images and MIPs were
obtained and reviewed to evaluate the vascular anatomy.
CONTRAST:  100mL OMNIPAQUE IOHEXOL 300 MG/ML  SOLN

[Series 4: axial arterial · axial · arterial · 0.73mm/px · z∈[-862,-704]mm · 4 of 238 slices shown]
[im 27/238  soft-tissue]
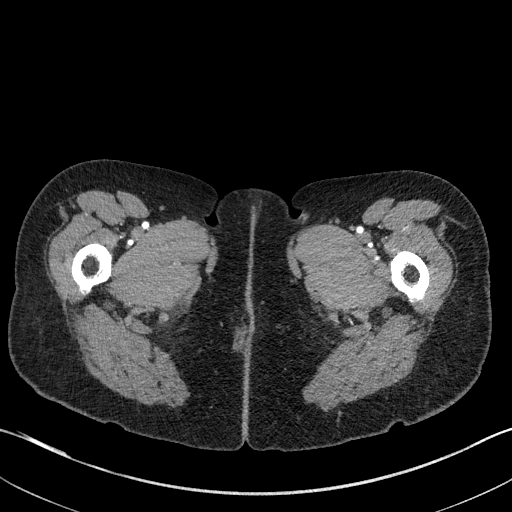
[im 53/238  soft-tissue]
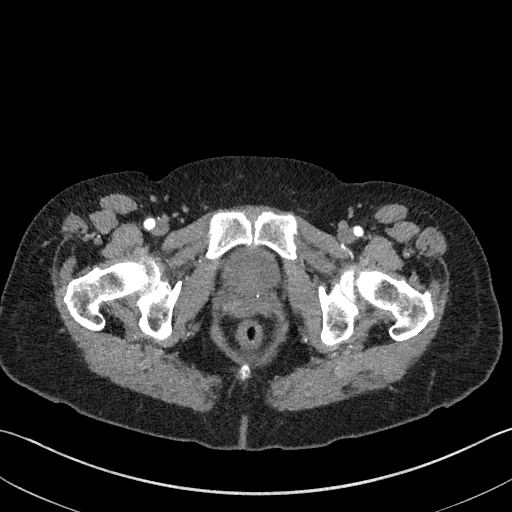
[im 80/238  soft-tissue]
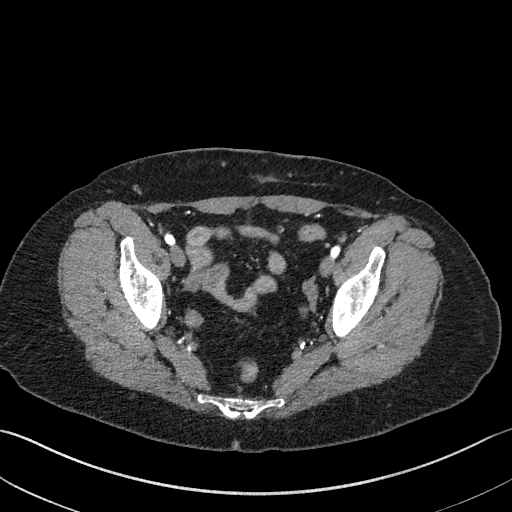
[im 106/238  soft-tissue]
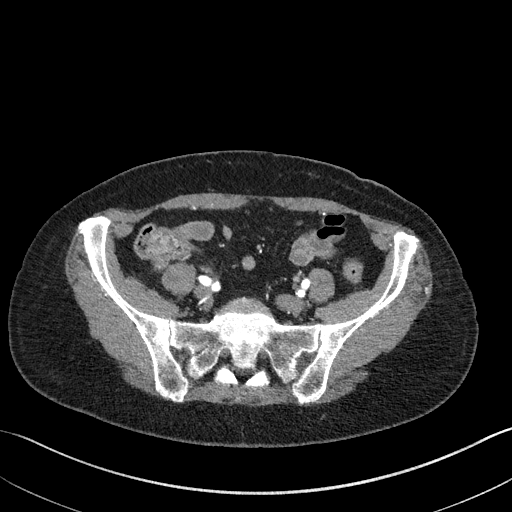

[Series 11: axial venous · axial · portal-venous · 0.74mm/px · z∈[-832,-507]mm · 6 of 93 slices shown, 11 images]
[im 14/93  soft-tissue]
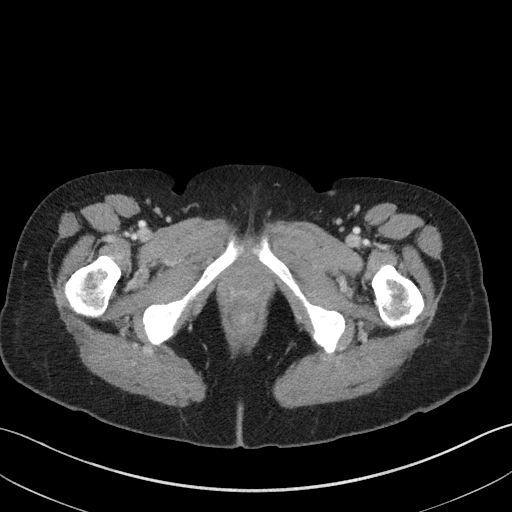
[im 14/93  bone]
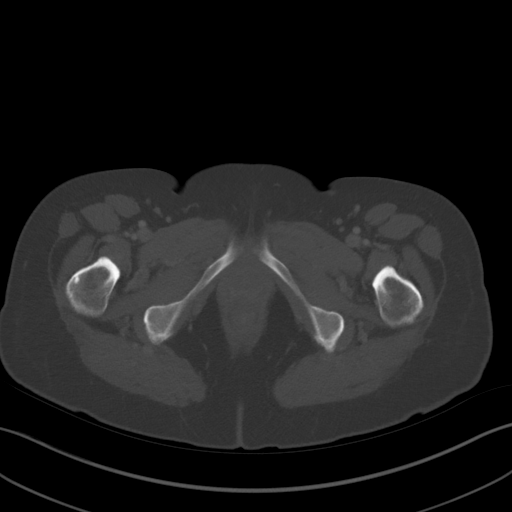
[im 27/93  soft-tissue]
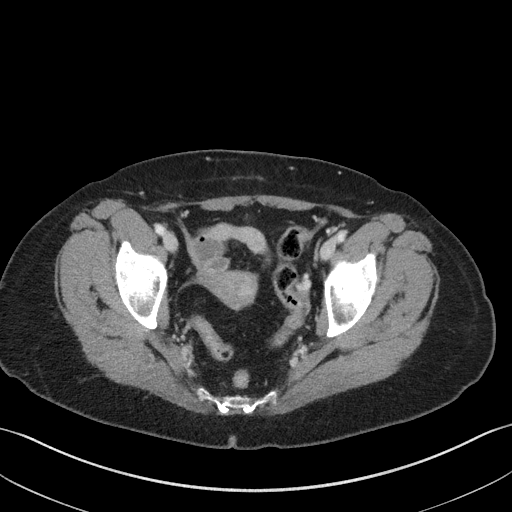
[im 40/93  soft-tissue]
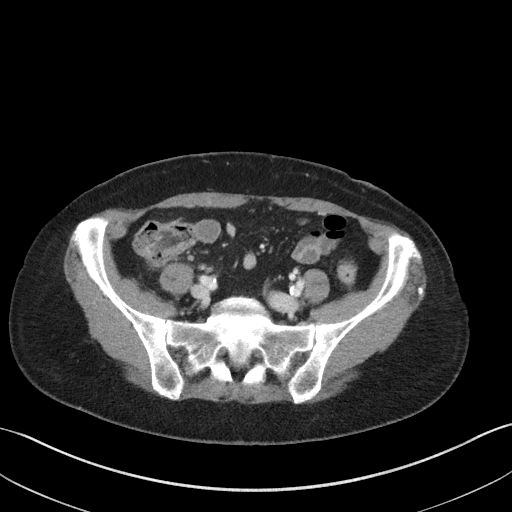
[im 40/93  lung]
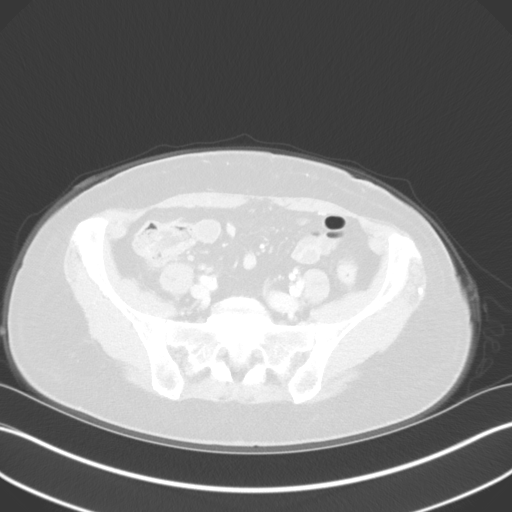
[im 53/93  soft-tissue]
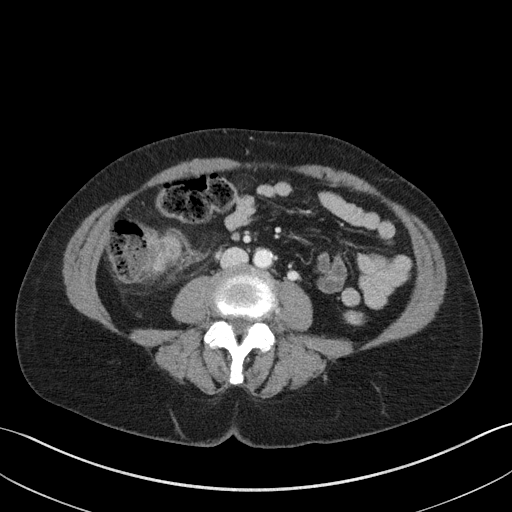
[im 53/93  lung]
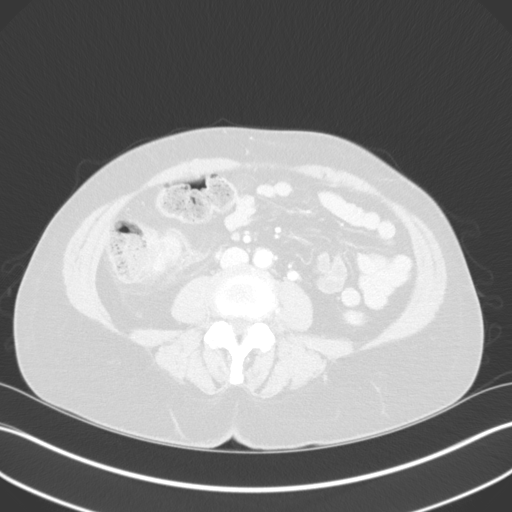
[im 66/93  soft-tissue]
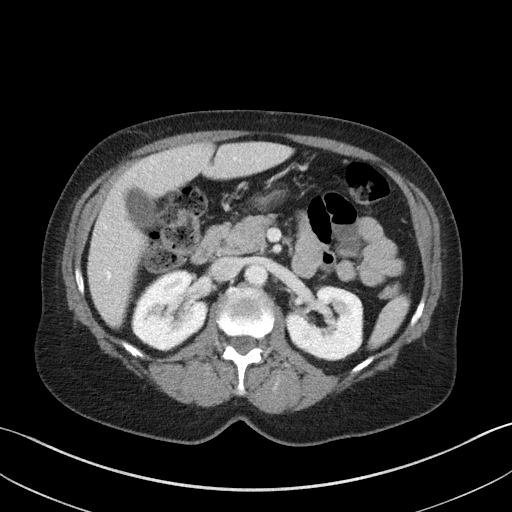
[im 66/93  lung]
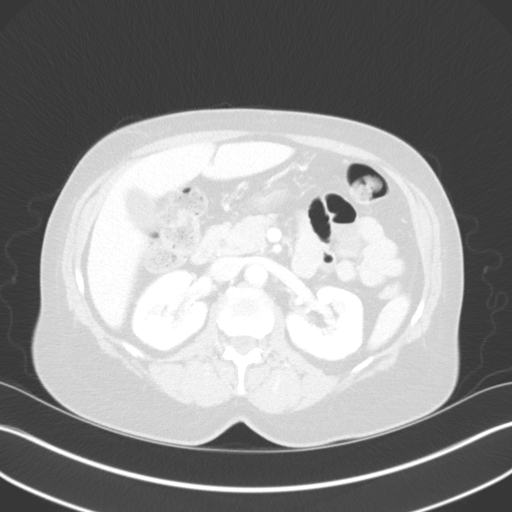
[im 79/93  soft-tissue]
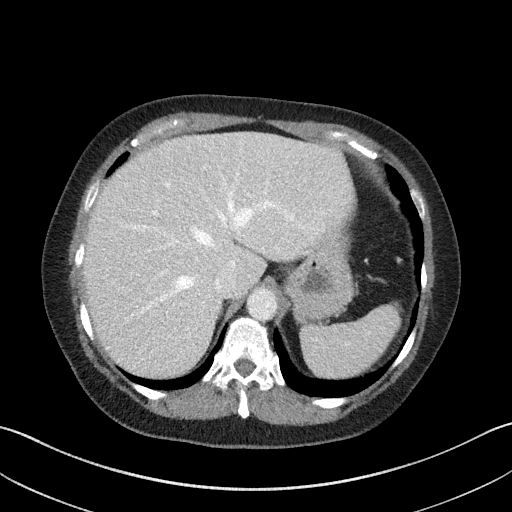
[im 79/93  lung]
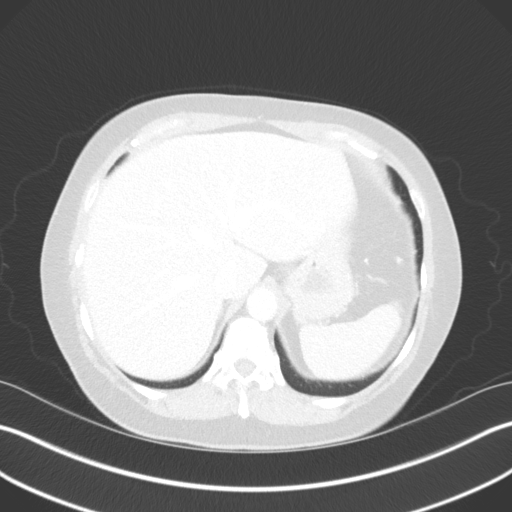

[Series 13: sag venous · sagittal · portal-venous · 0.67mm/px · 1 of 135 slices shown, 2 images]
[im 45/135  soft-tissue]
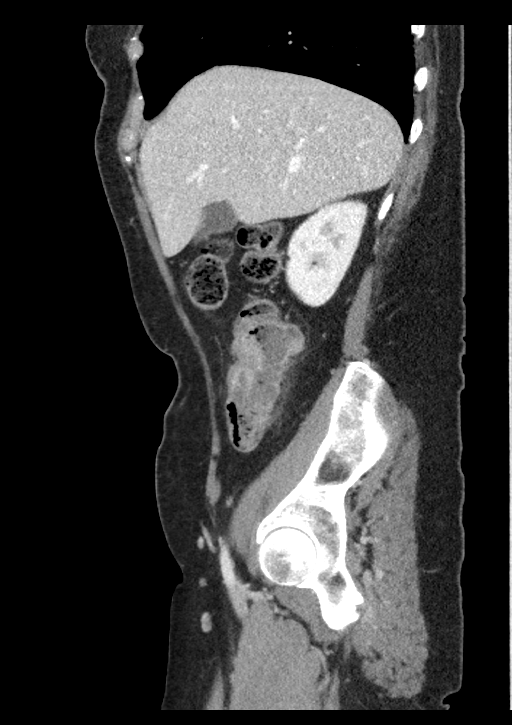
[im 45/135  bone]
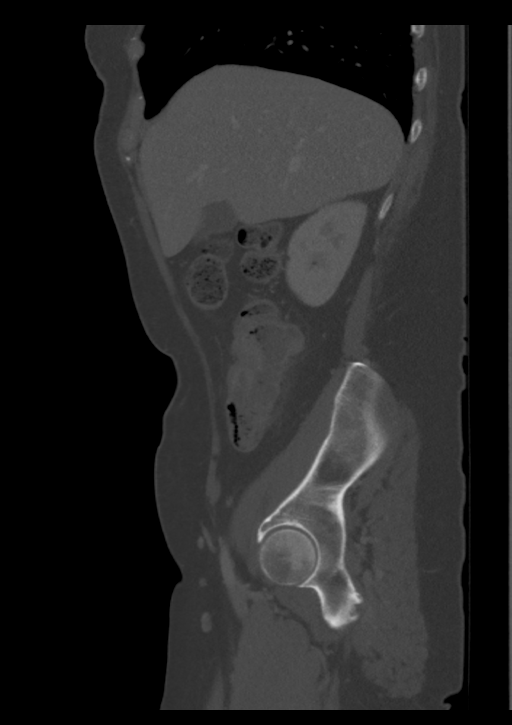

[11 of 36 positions shown; findings below may reference images not displayed]

FINDINGS: VASCULAR

Aorta: Minimal scattered atheromatous plaque without flow-limiting
stenosis or aneurysmal dilatation.

Celiac: Mild stenosis at the origin. Otherwise patent without
evidence of aneurysm, dissection, and vasculitis.

SMA: Patent without evidence of aneurysm, dissection, vasculitis or
significant stenosis.

Renals: Both renal arteries are patent without evidence of aneurysm,
dissection, vasculitis, fibromuscular dysplasia or significant
stenosis.

IMA: Patent.

Inflow: Patent without evidence of aneurysm, dissection, vasculitis
or significant stenosis.

Proximal Outflow: Bilateral common femoral and visualized portions
of the superficial and profunda femoral arteries are patent without
evidence of aneurysm, dissection, vasculitis or significant
stenosis.

Veins: Hepatic, portal, splenic, superior mesenteric veins are
patent.

Review of the MIP images confirms the above findings.

NON-VASCULAR

Lower chest: No acute abnormality.

Hepatobiliary: No focal liver abnormality is seen. No gallstones,
gallbladder wall thickening, or biliary dilatation.

Pancreas: Unremarkable. No pancreatic ductal dilatation or
surrounding inflammatory changes.

Spleen: Normal in size without focal abnormality.

Adrenals/Urinary Tract: Mild thickening of the left adrenal gland
without discrete nodule. Adrenal glands are otherwise normal in
appearance.

Kidneys and ureters are normal. No significant abnormality of the
bladder.

Stomach/Bowel: No significant abnormality of stomach or small bowel.
Masslike thickening of the wall of the cecum highly suspicious for
malignancy. There is mild adjacent fat stranding. No bowel
dilatation to indicate ileus or obstruction.

Lymphatic: No enlarged abdominal or pelvic lymph nodes.

Reproductive: Uterus and bilateral adnexa are unremarkable.

Other: No abdominal wall hernia or abnormality. No abdominopelvic
ascites.

Musculoskeletal: No acute osseous abnormality. Advanced degenerative
changes seen at L5-S1 with severe right and moderate left neural
foraminal stenosis secondary to facet spurring.
IMPRESSION: VASCULAR

No significant narrowing of mesenteric arteries or veins.

NON-VASCULAR

Masslike thickening of the wall of the cecum highly suspicious for
malignancy. Further evaluation with colonoscopy should be performed.

These results will be called to the ordering clinician or
representative by the Radiologist Assistant, and communication
documented in the PACS or [REDACTED].

## 2022-05-15 DIAGNOSIS — J0100 Acute maxillary sinusitis, unspecified: Secondary | ICD-10-CM | POA: Diagnosis not present

## 2022-05-23 IMAGING — CT CT CHEST W/ CM
2 of 4 series · 15 of 36 positions shown, 18 images · IV contrast (omnipaque)
Comparison: Chest CT 04/17/2018.

CLINICAL DATA: 69-year-old female with history of colon cancer.
Staging examination.

EXAM:
CT CHEST WITH CONTRAST
TECHNIQUE: Multidetector CT imaging of the chest was performed during
intravenous contrast administration.
CONTRAST:  75mL OMNIPAQUE IOHEXOL 300 MG/ML  SOLN

[Series 2: axial chest 2.00 · axial · 0.59mm/px · z∈[-1170,-920]mm · 12 of 149 slices shown, 15 images]
[im 12/149  mediastinal]
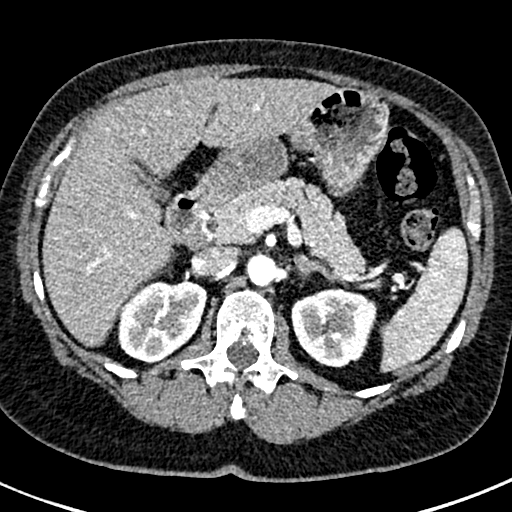
[im 12/149  lung]
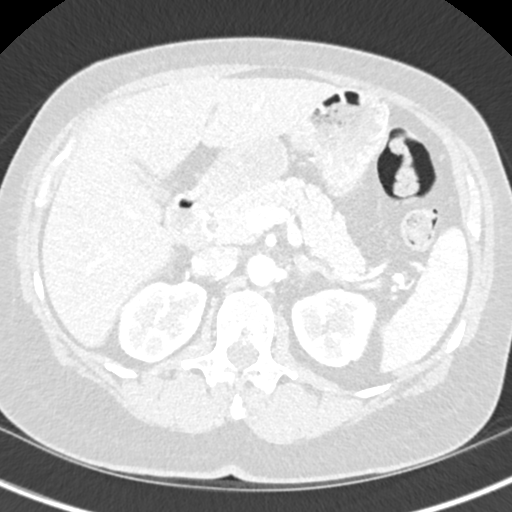
[im 23/149  lung]
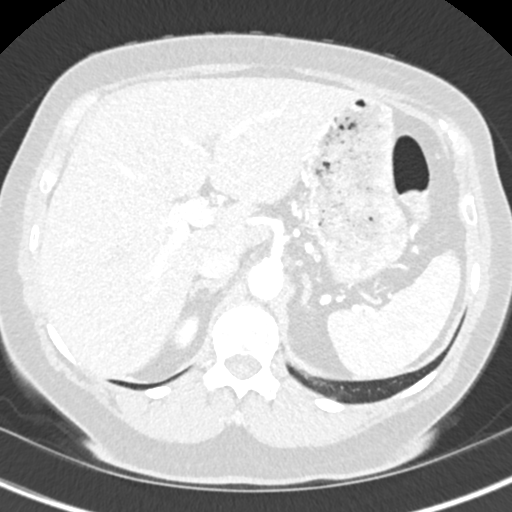
[im 35/149  lung]
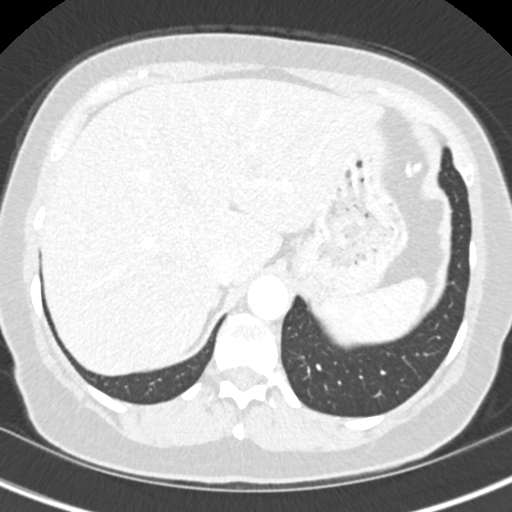
[im 46/149  lung]
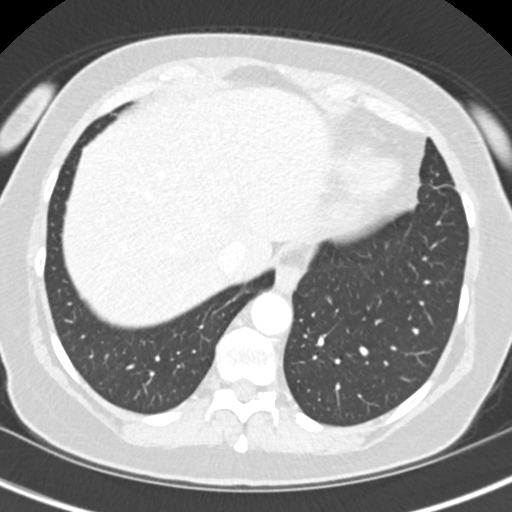
[im 57/149  mediastinal]
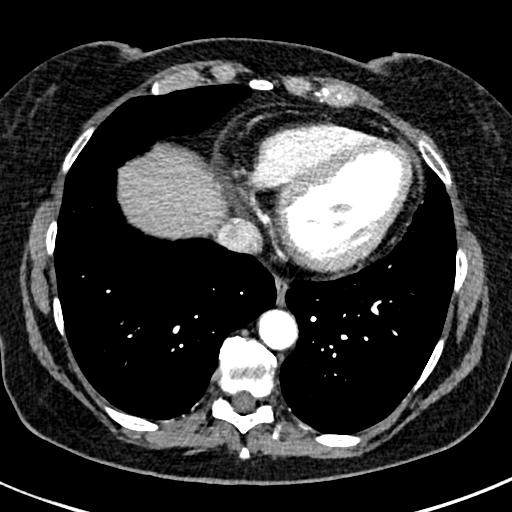
[im 57/149  lung]
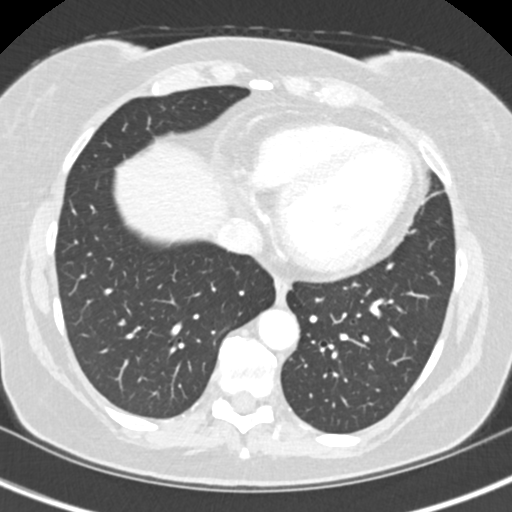
[im 69/149  lung]
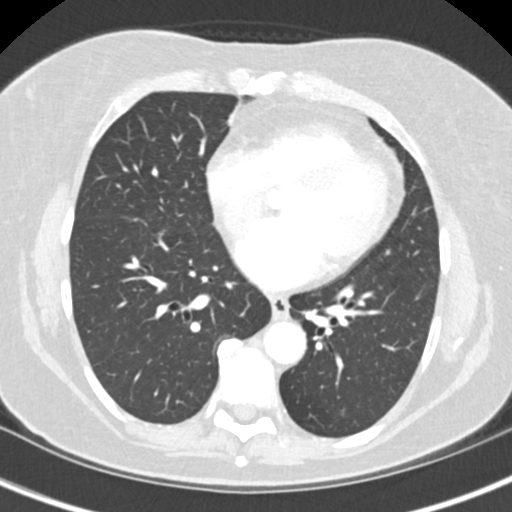
[im 80/149  lung]
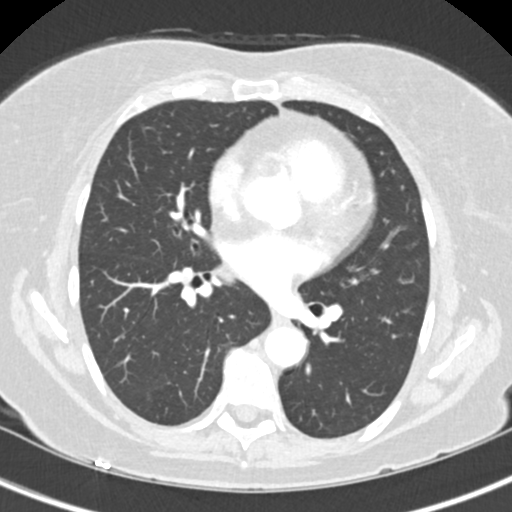
[im 92/149  lung]
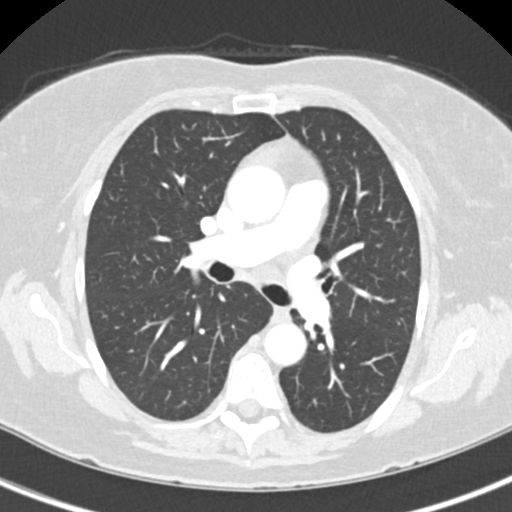
[im 103/149  mediastinal]
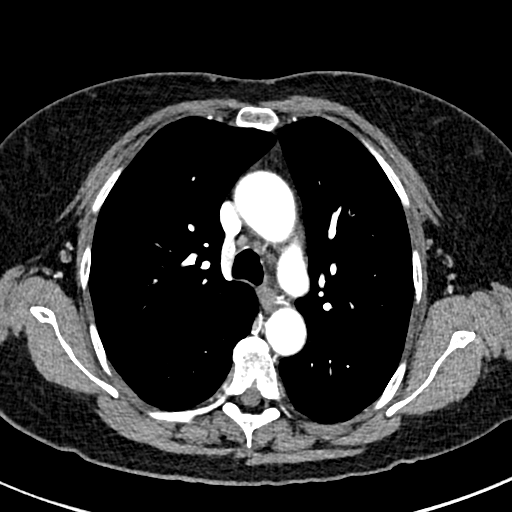
[im 103/149  lung]
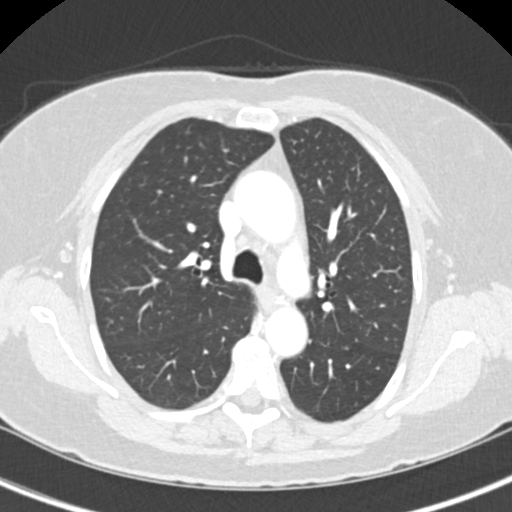
[im 114/149  lung]
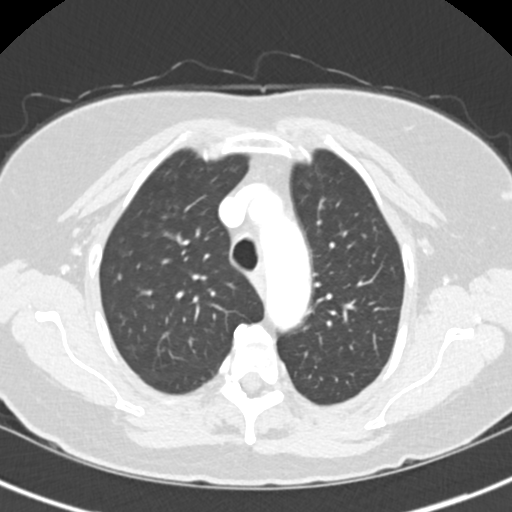
[im 126/149  lung]
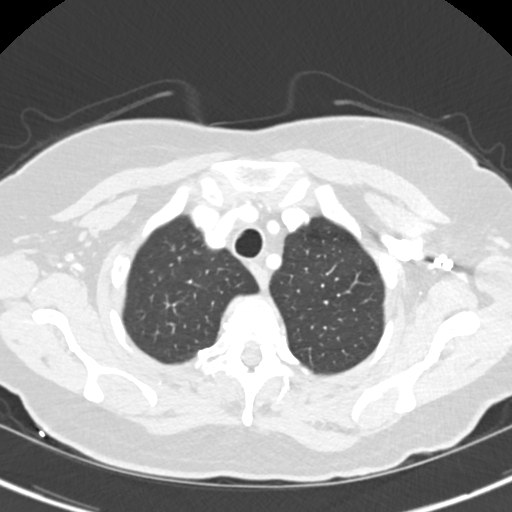
[im 137/149  lung]
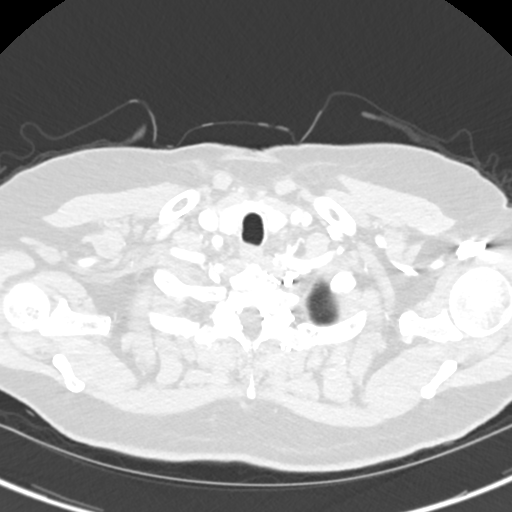

[Series 4: coronal chest 2.00 cor · coronal · 0.58mm/px · 3 of 135 slices shown]
[im 27/135  lung]
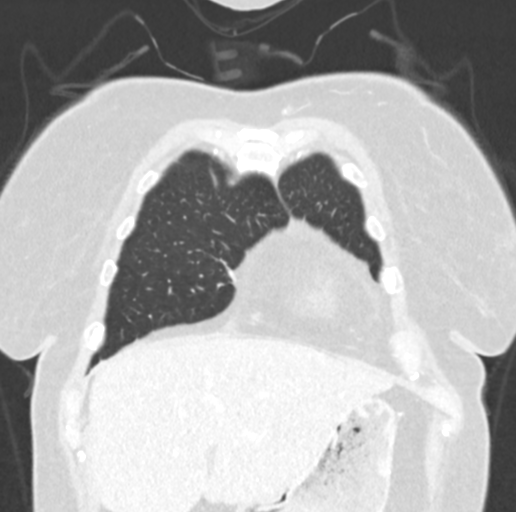
[im 54/135  lung]
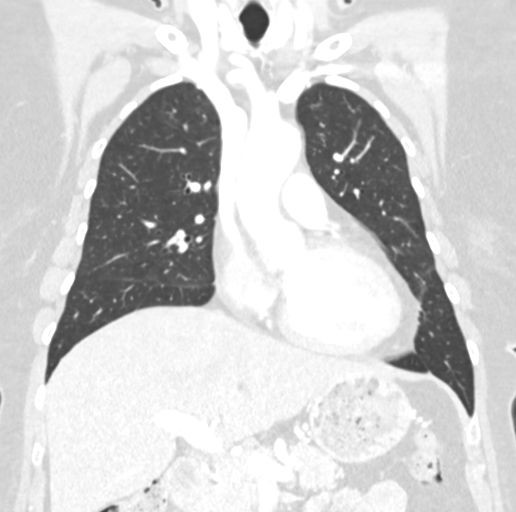
[im 81/135  lung]
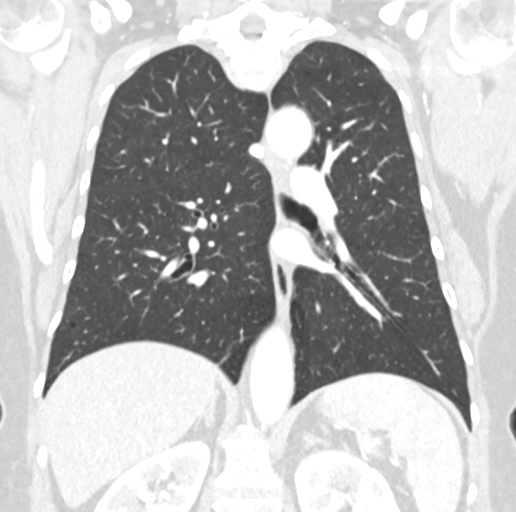

[15 of 36 positions shown; findings below may reference images not displayed]

FINDINGS: Cardiovascular: Heart size is normal. There is no significant
pericardial fluid, thickening or pericardial calcification. There is
aortic atherosclerosis, as well as atherosclerosis of the great
vessels of the mediastinum and the coronary arteries, including
calcified atherosclerotic plaque in the left anterior descending
coronary artery.

Mediastinum/Nodes: No pathologically enlarged mediastinal or hilar
lymph nodes. Please note that accurate exclusion of hilar adenopathy
is limited on noncontrast CT scans. Esophagus is unremarkable in
appearance. No axillary lymphadenopathy.

Lungs/Pleura: No suspicious appearing pulmonary nodules or masses
are noted. No acute consolidative airspace disease. No pleural
effusions.

Upper Abdomen: Unremarkable.

Musculoskeletal: There are no aggressive appearing lytic or blastic
lesions noted in the visualized portions of the skeleton.
IMPRESSION: 1. No findings to suggest metastatic disease to the chest.
2. Aortic atherosclerosis, in addition to left anterior descending
coronary artery disease. Please note that although the presence of
coronary artery calcium documents the presence of coronary artery
disease, the severity of this disease and any potential stenosis
cannot be assessed on this non-gated CT examination. Assessment for
potential risk factor modification, dietary therapy or pharmacologic
therapy may be warranted, if clinically indicated.

Aortic Atherosclerosis ([id number]-[id number]).

## 2022-06-23 ENCOUNTER — Telehealth: Payer: Self-pay | Admitting: Oncology

## 2022-06-23 NOTE — Telephone Encounter (Signed)
LVM for pt to r/s 11/22 appt time to earlier or move to 11/21 if she is available.

## 2022-06-30 DIAGNOSIS — E559 Vitamin D deficiency, unspecified: Secondary | ICD-10-CM | POA: Diagnosis not present

## 2022-06-30 DIAGNOSIS — C182 Malignant neoplasm of ascending colon: Secondary | ICD-10-CM | POA: Diagnosis not present

## 2022-06-30 DIAGNOSIS — E7849 Other hyperlipidemia: Secondary | ICD-10-CM | POA: Diagnosis not present

## 2022-06-30 DIAGNOSIS — I700 Atherosclerosis of aorta: Secondary | ICD-10-CM | POA: Diagnosis not present

## 2022-07-03 DIAGNOSIS — I700 Atherosclerosis of aorta: Secondary | ICD-10-CM | POA: Diagnosis not present

## 2022-07-03 DIAGNOSIS — E559 Vitamin D deficiency, unspecified: Secondary | ICD-10-CM | POA: Diagnosis not present

## 2022-07-03 DIAGNOSIS — E785 Hyperlipidemia, unspecified: Secondary | ICD-10-CM | POA: Diagnosis not present

## 2022-07-03 DIAGNOSIS — Z0000 Encounter for general adult medical examination without abnormal findings: Secondary | ICD-10-CM | POA: Diagnosis not present

## 2022-07-03 DIAGNOSIS — Z85038 Personal history of other malignant neoplasm of large intestine: Secondary | ICD-10-CM | POA: Diagnosis not present

## 2022-07-03 DIAGNOSIS — F17210 Nicotine dependence, cigarettes, uncomplicated: Secondary | ICD-10-CM | POA: Diagnosis not present

## 2022-07-03 DIAGNOSIS — F419 Anxiety disorder, unspecified: Secondary | ICD-10-CM | POA: Diagnosis not present

## 2022-07-03 DIAGNOSIS — R030 Elevated blood-pressure reading, without diagnosis of hypertension: Secondary | ICD-10-CM | POA: Diagnosis not present

## 2022-07-03 DIAGNOSIS — Z1331 Encounter for screening for depression: Secondary | ICD-10-CM | POA: Diagnosis not present

## 2022-07-07 ENCOUNTER — Ambulatory Visit: Admission: RE | Admit: 2022-07-07 | Payer: Medicare HMO | Source: Ambulatory Visit

## 2022-07-10 ENCOUNTER — Ambulatory Visit
Admission: RE | Admit: 2022-07-10 | Discharge: 2022-07-10 | Disposition: A | Discharge status: Home / Self Care | Payer: Medicare HMO | Source: Ambulatory Visit | Attending: Oncology | Admitting: Oncology

## 2022-07-10 ENCOUNTER — Inpatient Hospital Stay: Payer: Medicare HMO | Attending: Oncology

## 2022-07-10 DIAGNOSIS — F17210 Nicotine dependence, cigarettes, uncomplicated: Secondary | ICD-10-CM | POA: Insufficient documentation

## 2022-07-10 DIAGNOSIS — Z79899 Other long term (current) drug therapy: Secondary | ICD-10-CM | POA: Diagnosis not present

## 2022-07-10 DIAGNOSIS — Z85038 Personal history of other malignant neoplasm of large intestine: Secondary | ICD-10-CM | POA: Insufficient documentation

## 2022-07-10 DIAGNOSIS — I700 Atherosclerosis of aorta: Secondary | ICD-10-CM | POA: Diagnosis not present

## 2022-07-10 DIAGNOSIS — Z9049 Acquired absence of other specified parts of digestive tract: Secondary | ICD-10-CM | POA: Insufficient documentation

## 2022-07-10 DIAGNOSIS — C182 Malignant neoplasm of ascending colon: Secondary | ICD-10-CM | POA: Insufficient documentation

## 2022-07-10 DIAGNOSIS — Z7982 Long term (current) use of aspirin: Secondary | ICD-10-CM | POA: Diagnosis not present

## 2022-07-10 DIAGNOSIS — I2510 Atherosclerotic heart disease of native coronary artery without angina pectoris: Secondary | ICD-10-CM | POA: Diagnosis not present

## 2022-07-10 LAB — COMPREHENSIVE METABOLIC PANEL
ALT: 23 U/L (ref 0–44)
AST: 21 U/L (ref 15–41)
Albumin: 4.1 g/dL (ref 3.5–5.0)
Alkaline Phosphatase: 79 U/L (ref 38–126)
Anion gap: 5 (ref 5–15)
BUN: 8 mg/dL (ref 8–23)
CO2: 24 mmol/L (ref 22–32)
Calcium: 8.8 mg/dL — ABNORMAL LOW (ref 8.9–10.3)
Chloride: 110 mmol/L (ref 98–111)
Creatinine, Ser: 0.63 mg/dL (ref 0.44–1.00)
GFR, Estimated: >60 mL/min (ref >60)
Glucose, Bld: 94 mg/dL (ref 70–99)
Potassium: 3.8 mmol/L (ref 3.5–5.1)
Sodium: 139 mmol/L (ref 135–145)
Total Bilirubin: 0.5 mg/dL (ref 0.3–1.2)
Total Protein: 7.5 g/dL (ref 6.5–8.1)

## 2022-07-10 LAB — CBC WITH DIFFERENTIAL/PLATELET
Abs Immature Granulocytes: 0.03 10*3/uL (ref 0.00–0.07)
Basophils Absolute: 0.1 10*3/uL (ref 0.0–0.1)
Basophils Relative: 1 %
Eosinophils Absolute: 0.2 10*3/uL (ref 0.0–0.5)
Eosinophils Relative: 3 %
HCT: 43.2 % (ref 36.0–46.0)
Hemoglobin: 14.9 g/dL (ref 12.0–15.0)
Immature Granulocytes: 0 %
Lymphocytes Relative: 38 %
Lymphs Abs: 3.1 10*3/uL (ref 0.7–4.0)
MCH: 32.3 pg (ref 26.0–34.0)
MCHC: 34.5 g/dL (ref 30.0–36.0)
MCV: 93.5 fL (ref 80.0–100.0)
Monocytes Absolute: 0.5 10*3/uL (ref 0.1–1.0)
Monocytes Relative: 6 %
Neutro Abs: 4.3 10*3/uL (ref 1.7–7.7)
Neutrophils Relative %: 52 %
Platelets: 248 10*3/uL (ref 150–400)
RBC: 4.62 MIL/uL (ref 3.87–5.11)
RDW: 12.6 % (ref 11.5–15.5)
WBC: 8.2 10*3/uL (ref 4.0–10.5)
nRBC: 0 % (ref 0.0–0.2)

## 2022-07-10 MED ORDER — IOHEXOL 300 MG/ML  SOLN
100.0000 mL | Freq: Once | INTRAMUSCULAR | Status: AC | PRN
  Administered 2022-07-10: 100 mL via INTRAVENOUS

## 2022-07-11 LAB — CEA: CEA: 5.5 ng/mL — ABNORMAL HIGH (ref 0.0–4.7)

## 2022-07-12 ENCOUNTER — Encounter: Payer: Self-pay | Admitting: Oncology

## 2022-07-12 ENCOUNTER — Inpatient Hospital Stay: Payer: Medicare HMO | Admitting: Oncology

## 2022-07-12 ENCOUNTER — Ambulatory Visit: Payer: Medicare HMO | Admitting: Oncology

## 2022-07-12 VITALS — BP 131/77 | HR 90 | Temp 97.3°F | Resp 18 | Wt 164.0 lb

## 2022-07-12 DIAGNOSIS — Z85038 Personal history of other malignant neoplasm of large intestine: Secondary | ICD-10-CM | POA: Diagnosis not present

## 2022-07-12 DIAGNOSIS — C182 Malignant neoplasm of ascending colon: Secondary | ICD-10-CM | POA: Diagnosis not present

## 2022-07-12 DIAGNOSIS — F17210 Nicotine dependence, cigarettes, uncomplicated: Secondary | ICD-10-CM | POA: Diagnosis not present

## 2022-07-12 DIAGNOSIS — Z9049 Acquired absence of other specified parts of digestive tract: Secondary | ICD-10-CM | POA: Diagnosis not present

## 2022-07-12 DIAGNOSIS — Z7982 Long term (current) use of aspirin: Secondary | ICD-10-CM | POA: Diagnosis not present

## 2022-07-12 DIAGNOSIS — Z79899 Other long term (current) drug therapy: Secondary | ICD-10-CM | POA: Diagnosis not present

## 2022-07-12 NOTE — Assessment & Plan Note (Addendum)
#  Ascending colon carcinoma with mucinous feature, pT3 pN0 cMx, Stage II disease, no high risk features,  Labs reviewed and discussed with patient. CEA is chronically elevated, close to baseline.  Possible secondary to smoking history. Patient is doing very well clinically. CT scan shows no signs of cancer recurrence. Continue surveillance.  Repeat CT in 6 months

## 2022-07-12 NOTE — Progress Notes (Signed)
Pt here for follow up. Pt reports she had stomach virus last week, but she has recovered.

## 2022-07-12 NOTE — Progress Notes (Signed)
Hematology/Oncology Progress note [phone] [phone]       Patient Care Team: Adin Hector, MD as PCP - General (Internal Medicine) Clent Jacks, RN as Oncology Nurse Navigator  ASSESSMENT & PLAN:   Malignant neoplasm of ascending colon [facility]) #Ascending colon carcinoma with mucinous feature, pT3 pN0 cMx, Stage II disease, no high risk features,  Labs reviewed and discussed with patient. CEA is chronically elevated, close to baseline.  Possible secondary to smoking history. Patient is doing very well clinically. CT scan shows no signs of cancer recurrence. Continue surveillance.  Repeat CT in 6 months   Orders Placed This Encounter  Procedures   CT CHEST ABDOMEN PELVIS W CONTRAST    Standing Status:   Future    Standing Expiration Date:   07/13/2023    Order Specific Question:   If indicated for the ordered procedure, I authorize the administration of contrast media per Radiology protocol    Answer:   Yes    Order Specific Question:   Does the patient have a contrast media/X-ray dye allergy?    Answer:   No    Order Specific Question:   Preferred imaging location?    Answer:   [facility]    Order Specific Question:   Is Oral Contrast requested for this exam?    Answer:   Yes, Per Radiology protocol   CBC with Differential/Platelet    Standing Status:   Future    Standing Expiration Date:   07/12/2023   Comprehensive metabolic panel    Standing Status:   Future    Standing Expiration Date:   07/12/2023   CEA    Standing Status:   Future    Standing Expiration Date:   07/12/2023   Follow-up in 6 months All questions were answered. The patient knows to call the clinic with any problems, questions or concerns.  Earlie Server, MD, PhD [facility] Hematology Oncology 07/12/2022   CHIEF COMPLAINTS/REASON FOR VISIT:  right colon cancer follow up  HISTORY OF PRESENTING ILLNESS:   Katie Liu is a  71 y.o.  female presents for  follow up of Stage II right colon cancer Oncology History  Malignant neoplasm of ascending colon ([place])  12/30/2020 Imaging   CT abdomen/pelvis angiogram was obtained for evaluation of postprandial abdominal pain. There was no significant narrowing of the mesenteric arteries or veins.  Masslike thickening of the wall of the cecum spacious for malignancy   01/11/2021 Initial Diagnosis   Malignant neoplasm of ascending colon  -12/21/2020, patient was referred to see [facility] gastroenterology Dr. Tina Griffiths for evaluation of generalized postprandial abdominal pain and unintentional weight loss.  Symptom onset is 2 to 3 months, some nausea without emesis.  20 pounds of unintentional weight loss within the past few months.  Early satiety, only eating one third of her plate.   -01/03/2021, EGD showed esophageal mucosal.  1cm hiatal hernia Colonoscopy findings includes 7 mm polyp in the rectum, resected and retrieved, nonbleeding internal -pathology showed tubular adenoma negative for high-grade dysplasia and malignancy.  Hemorrhoids. Malignant partially obstructing tumor in the proximal ascending colon.  Tattooed and biopsied. -Proximal ascending colon mass biopsy pathology showed invasive moderately differentiated adenocarcinoma   02/24/2021 Surgery   patient underwent right hemicolectomy. Invasive adenocarcinoma with mucinous features 8cm, grade 2, negative LVI or perineural invasion. carcinoma extends into pericolonic connective tissue. Margin is negative. 0/21 lymph nodes positive.  pT3 pN0, stage II      03/02/2021 Cancer  Staging   Staging form: Colon and Rectum, AJCC 8th Edition - Pathologic stage from 03/02/2021: Stage IIA (pT3, pN0, cM0) - Signed by Earlie Server, MD on 03/02/2021 Stage prefix: Initial diagnosis Total positive nodes: 0   12/05/2021 Imaging   CT chest abdomen pelvis with contrast showed no evidence of recurrence or metastatic disease in the chest, abdomen or pelvis.     07/10/2022 Imaging   CT chest abdomen pelvis with contrast 1. Prior right hemicolectomy with ileocolic anastomosis without evidence of local recurrence. 2. No evidence of metastatic disease within the chest, abdomen, or pelvis. 3. Moderate volume of formed stool throughout the colon suggestive of constipation. 4.  Aortic Atherosclerosis .      INTERVAL HISTORY Katie Liu is a 71 y.o. female who has above history reviewed by me today presents for follow up visit for management of stage II right colon cancer Patient currently smokes 2 to 3 cigarettes/day.  Patient is currently off Zoloft.  She has started on Wellbutrin per PCPs recommendation for smoke cessation. She reports doing very well.  Patient has had 1+ year milestone colonoscopy done in the summer.  Currently colonoscopy result is not available in current EMR. Denies any unintentional weight loss, blood in the stool She has recently recovered from a upper respiratory virus infection.  No new complaints.    Review of Systems  Constitutional:  Negative for appetite change, chills, fatigue, fever and unexpected weight change.  HENT:   Negative for hearing loss and voice change.   Eyes:  Negative for eye problems.  Respiratory:  Negative for chest tightness and cough.   Cardiovascular:  Negative for chest pain.  Gastrointestinal:  Negative for abdominal distention, abdominal pain and blood in stool.  Endocrine: Negative for hot flashes.  Genitourinary:  Negative for difficulty urinating and frequency.   Musculoskeletal:  Negative for arthralgias.  Skin:  Negative for itching and rash.  Neurological:  Negative for extremity weakness.  Hematological:  Negative for adenopathy.  Psychiatric/Behavioral:  Negative for confusion.     MEDICAL HISTORY:  Past Medical History:  Diagnosis Date   Allergy    Arthritis    GERD (gastroesophageal reflux disease)    Hyperlipemia    Vitamin D deficiency     SURGICAL HISTORY: Past  Surgical History:  Procedure Laterality Date   ACHILLES TENDON REPAIR Bilateral    CESAREAN SECTION     COLONOSCOPY WITH PROPOFOL N/A 01/03/2021   Procedure: COLONOSCOPY WITH PROPOFOL;  Surgeon: Toledo, Benay Pike, MD;  Location: [facility] ENDOSCOPY;  Service: Gastroenterology;  Laterality: N/A;   ESOPHAGOGASTRODUODENOSCOPY (EGD) WITH PROPOFOL N/A 01/03/2021   Procedure: ESOPHAGOGASTRODUODENOSCOPY (EGD) WITH PROPOFOL;  Surgeon: Toledo, Benay Pike, MD;  Location: [facility] ENDOSCOPY;  Service: Gastroenterology;  Laterality: N/A;   KNEE ARTHROSCOPY W/ ACL RECONSTRUCTION     LAPAROSCOPIC RIGHT HEMI COLECTOMY N/A 02/24/2021   Procedure: LAPAROSCOPIC RIGHT HEMI COLECTOMY;  Surgeon: Ileana Roup, MD;  Location: [facility] ORS;  Service: General;  Laterality: N/A;    SOCIAL HISTORY: Social History   Socioeconomic History   Marital status: Married    Spouse name: Not on file   Number of children: Not on file   Years of education: Not on file   Highest education level: Not on file  Occupational History   Not on file  Tobacco Use   Smoking status: Every Day    Packs/day: 0.25    Years: 9.00    Total pack years: 2.25    Types: Cigarettes  Last attempt to quit: 01/25/2021    Years since quitting: 1.4   Smokeless tobacco: Never  Vaping Use   Vaping Use: Never used  Substance and Sexual Activity   Alcohol use: Yes    Alcohol/week: 2.0 standard drinks of alcohol    Types: 2 Glasses of wine per week    Comment: occasional glass of wine.   Drug use: Never   Sexual activity: Not on file  Other Topics Concern   Not on file  Social History Narrative   Not on file   Social Determinants of Health   Financial Resource Strain: Not on file  Food Insecurity: Not on file  Transportation Needs: Not on file  Physical Activity: Not on file  Stress: Not on file  Social Connections: Not on file  Intimate Partner Violence: Not on file    FAMILY HISTORY: Family History  Problem Relation Age of Onset    Cancer Neg Hx     ALLERGIES:  has No Known Allergies.  MEDICATIONS:  Current Outpatient Medications  Medication Sig Dispense Refill   aspirin 81 MG chewable tablet Chew 81 mg by mouth daily.     atorvastatin (LIPITOR) 40 MG tablet Take 40 mg by mouth daily.     buPROPion (WELLBUTRIN XL) 150 MG 24 hr tablet Take 150 mg by mouth daily.     cetirizine (ZYRTEC) 10 MG tablet Take 10 mg by mouth daily as needed for allergies.     Cholecalciferol (VITAMIN D3) 25 MCG (1000 UT) CAPS Take 2,000 Units by mouth at bedtime.     Coenzyme Q10 (COQ-10) 100 MG CAPS      cyanocobalamin (VITAMIN B12) 1000 MCG tablet Take 1,000 mcg by mouth daily.     NON FORMULARY Place 7 drops under the tongue at bedtime. CBD oil (contains NO "THC"):     Omega-3 1000 MG CAPS Take 1,000 mg by mouth daily. (Patient not taking: Reported on 07/12/2022)     Propylene Glycol (SYSTANE BALANCE OP) Place 1-2 drops into both eyes 2 (two) times daily as needed (for dryness or irritation). (Patient not taking: Reported on 07/12/2022)     No current facility-administered medications for this visit.     PHYSICAL EXAMINATION: ECOG PERFORMANCE STATUS: 1 - Symptomatic but completely ambulatory Vitals:   07/12/22 1023  BP: 131/77  Pulse: 90  Resp: 18  Temp: (!) 97.3 F (36.3 C)   Filed Weights   07/12/22 1023  Weight: 164 lb (74.4 kg)    Physical Exam Constitutional:      General: She is not in acute distress. HENT:     Head: Normocephalic and atraumatic.  Eyes:     General: No scleral icterus. Cardiovascular:     Rate and Rhythm: Normal rate and regular rhythm.     Heart sounds: Normal heart sounds.  Pulmonary:     Effort: Pulmonary effort is normal. No respiratory distress.     Breath sounds: No wheezing.  Abdominal:     General: Bowel sounds are normal. There is no distension.     Palpations: Abdomen is soft.  Musculoskeletal:        General: No deformity. Normal range of motion.     Cervical back: Normal  range of motion and neck supple.  Skin:    General: Skin is warm and dry.     Findings: No erythema or rash.  Neurological:     Mental Status: She is alert and oriented to person, place, and time. Mental status is  at baseline.     Cranial Nerves: No cranial nerve deficit.     Coordination: Coordination normal.  Psychiatric:        Mood and Affect: Mood normal.     LABORATORY DATA:  I have reviewed the data as listed    Latest Ref Rng & Units 07/10/2022    8:10 AM 01/03/2022    9:10 AM 09/06/2021    8:52 AM  CBC  WBC 4.0 - 10.5 K/uL 8.2  9.2  7.6   Hemoglobin 12.0 - 15.0 g/dL 14.9  14.8  15.0   Hematocrit 36.0 - 46.0 % 43.2  43.3  43.8   Platelets 150 - 400 K/uL 248  248  245       Latest Ref Rng & Units 07/10/2022    8:10 AM 01/03/2022    9:10 AM 12/05/2021   10:38 AM  CMP  Glucose 70 - 99 mg/dL 94  125    BUN 8 - 23 mg/dL 8  14    Creatinine 0.44 - 1.00 mg/dL 0.63  0.59  0.70   Sodium 135 - 145 mmol/L 139  141    Potassium 3.5 - 5.1 mmol/L 3.8  3.7    Chloride 98 - 111 mmol/L 110  107    CO2 22 - 32 mmol/L 24  23    Calcium 8.9 - 10.3 mg/dL 8.8  9.6    Total Protein 6.5 - 8.1 g/dL 7.5  7.5    Total Bilirubin 0.3 - 1.2 mg/dL 0.5  0.8    Alkaline Phos 38 - 126 U/L 79  79    AST 15 - 41 U/L 21  24    ALT 0 - 44 U/L 23  22      RADIOGRAPHIC STUDIES: I have personally reviewed the radiological images as listed and agreed with the findings in the report. CT CHEST ABDOMEN PELVIS W CONTRAST  Result Date: 07/10/2022 CLINICAL DATA:  History of colon cancer, follow-up. * Tracking Code: BO *. EXAM: CT CHEST, ABDOMEN, AND PELVIS WITH CONTRAST TECHNIQUE: Multidetector CT imaging of the chest, abdomen and pelvis was performed following the standard protocol during bolus administration of intravenous contrast. RADIATION DOSE REDUCTION: This exam was performed according to the departmental dose-optimization program which includes automated exposure control, adjustment of the mA and/or  kV according to patient size and/or use of iterative reconstruction technique. CONTRAST:  16m OMNIPAQUE IOHEXOL 300 MG/ML  SOLN COMPARISON:  Multiple priors including CT chest abdomen pelvis December 05, 2021. FINDINGS: CT CHEST FINDINGS Cardiovascular: Aortic atherosclerosis. No central pulmonary embolus on this nondedicated study. Normal size heart. No significant pericardial effusion/thickening. Coronary artery calcifications. Mediastinum/Nodes: No supraclavicular adenopathy. No pathologically enlarged mediastinal, hilar or axillary lymph nodes. The esophagus is grossly unremarkable. Lungs/Pleura: No suspicious pulmonary nodules or masses. No focal airspace consolidation. No pleural effusion. No pneumothorax. Musculoskeletal: No aggressive lytic or blastic lesion of bone. Multilevel degenerative changes spine. CT ABDOMEN PELVIS FINDINGS Hepatobiliary: No suspicious hepatic lesion. Gallbladder is unremarkable. No biliary ductal dilation. Pancreas: No pancreatic ductal dilation or evidence of acute inflammation. Spleen: No splenomegaly. Adrenals/Urinary Tract: Bilateral adrenal glands appear normal. No hydronephrosis. Kidneys demonstrate symmetric enhancement and excretion of contrast material. Urinary bladder is unremarkable for degree of distension. Stomach/Bowel: Radiopaque enteric contrast material traverses the sigmoid colon. Stomach is unremarkable for degree of distension. No pathologic dilation of small or large bowel. Moderate volume of formed stool throughout the colon suggestive of constipation. Prior right hemicolectomy with ileocolic anastomosis without suspicious  nodularity along the suture line. Vascular/Lymphatic: Aortic atherosclerosis. No pathologically enlarged abdominal or pelvic lymph nodes. Reproductive: Uterus and bilateral adnexa are unremarkable. Other: No significant abdominopelvic free fluid. No discrete peritoneal or omental nodularity. Musculoskeletal: No aggressive lytic or blastic  lesion of bone. IMPRESSION: 1. Prior right hemicolectomy with ileocolic anastomosis without evidence of local recurrence. 2. No evidence of metastatic disease within the chest, abdomen, or pelvis. 3. Moderate volume of formed stool throughout the colon suggestive of constipation. 4.  Aortic Atherosclerosis (ICD10-I70.0). Electronically Signed   By: Dahlia Bailiff M.D.   On: 07/10/2022 15:47       ASSESSMENT & PLAN:  1. Malignant neoplasm of ascending colon [facility])    Cancer Staging  Malignant neoplasm of ascending colon [facility]) Staging form: Colon and Rectum, AJCC 8th Edition - Pathologic stage from 03/02/2021: Stage IIA (pT3, pN0, cM0) - Signed by Earlie Server, MD on 03/02/2021  #Ascending colon carcinoma with mucinous feature, pT3 pN0 cMx, Stage II disease, no high risk features,  Labs reviewed and discussed with patient. CEA has normalized Patient is doing very well clinically 12/05/2021, CT chest abdomen pelvis with contrast showed no evidence of recurrence or metastatic disease in the chest, abdomen or pelvis.  Aortic atherosclerosis. Continue surveillance.  Repeat CT in 6 months Recommend patient to get repeat colonoscopy done in July 2023-1 year after surgery.  Discussed case with [facility] team.   Tobacco use, I encouraged her smoke cessation efforts.  Previous elevated CEA could be secondary to smoking. Follow-up  CT chest abdomen pelvis in 6 months.  Lab MD to review CT results in 6 months. Orders Placed This Encounter  Procedures   CT CHEST ABDOMEN PELVIS W CONTRAST    Standing Status:   Future    Standing Expiration Date:   07/13/2023    Order Specific Question:   If indicated for the ordered procedure, I authorize the administration of contrast media per Radiology protocol    Answer:   Yes    Order Specific Question:   Does the patient have a contrast media/X-ray dye allergy?    Answer:   No    Order Specific Question:   Preferred imaging location?    Answer:   [facility]     Order Specific Question:   Is Oral Contrast requested for this exam?    Answer:   Yes, Per Radiology protocol   CBC with Differential/Platelet    Standing Status:   Future    Standing Expiration Date:   07/12/2023   Comprehensive metabolic panel    Standing Status:   Future    Standing Expiration Date:   07/12/2023   CEA    Standing Status:   Future    Standing Expiration Date:   07/12/2023    All questions were answered. The patient knows to call the clinic with any problems questions or concerns.  cc Adin Hector, MD      Earlie Server, MD, PhD Hematology Oncology  07/12/2022

## 2022-08-02 DIAGNOSIS — R5383 Other fatigue: Secondary | ICD-10-CM | POA: Diagnosis not present

## 2022-08-02 DIAGNOSIS — Z20822 Contact with and (suspected) exposure to covid-19: Secondary | ICD-10-CM | POA: Diagnosis not present

## 2022-08-03 DIAGNOSIS — R5383 Other fatigue: Secondary | ICD-10-CM | POA: Diagnosis not present

## 2022-08-03 DIAGNOSIS — Z20822 Contact with and (suspected) exposure to covid-19: Secondary | ICD-10-CM | POA: Diagnosis not present

## 2022-08-18 DIAGNOSIS — Z85038 Personal history of other malignant neoplasm of large intestine: Secondary | ICD-10-CM | POA: Diagnosis not present

## 2022-08-18 DIAGNOSIS — I700 Atherosclerosis of aorta: Secondary | ICD-10-CM | POA: Diagnosis not present

## 2022-08-18 DIAGNOSIS — E538 Deficiency of other specified B group vitamins: Secondary | ICD-10-CM | POA: Diagnosis not present

## 2022-08-18 DIAGNOSIS — R030 Elevated blood-pressure reading, without diagnosis of hypertension: Secondary | ICD-10-CM | POA: Diagnosis not present

## 2022-08-22 DIAGNOSIS — Z23 Encounter for immunization: Secondary | ICD-10-CM | POA: Diagnosis not present

## 2022-08-22 DIAGNOSIS — E538 Deficiency of other specified B group vitamins: Secondary | ICD-10-CM | POA: Diagnosis not present

## 2022-08-22 DIAGNOSIS — E559 Vitamin D deficiency, unspecified: Secondary | ICD-10-CM | POA: Diagnosis not present

## 2022-08-22 DIAGNOSIS — I700 Atherosclerosis of aorta: Secondary | ICD-10-CM | POA: Diagnosis not present

## 2022-08-22 DIAGNOSIS — Z85038 Personal history of other malignant neoplasm of large intestine: Secondary | ICD-10-CM | POA: Diagnosis not present

## 2022-08-22 DIAGNOSIS — H524 Presbyopia: Secondary | ICD-10-CM | POA: Diagnosis not present

## 2022-08-22 DIAGNOSIS — E785 Hyperlipidemia, unspecified: Secondary | ICD-10-CM | POA: Diagnosis not present

## 2022-08-30 DIAGNOSIS — H25812 Combined forms of age-related cataract, left eye: Secondary | ICD-10-CM | POA: Diagnosis not present

## 2022-08-30 DIAGNOSIS — H25813 Combined forms of age-related cataract, bilateral: Secondary | ICD-10-CM | POA: Diagnosis not present

## 2022-08-30 DIAGNOSIS — H25811 Combined forms of age-related cataract, right eye: Secondary | ICD-10-CM | POA: Diagnosis not present

## 2022-09-05 DIAGNOSIS — I700 Atherosclerosis of aorta: Secondary | ICD-10-CM | POA: Diagnosis not present

## 2022-09-05 DIAGNOSIS — E785 Hyperlipidemia, unspecified: Secondary | ICD-10-CM | POA: Diagnosis not present

## 2022-09-05 DIAGNOSIS — I10 Essential (primary) hypertension: Secondary | ICD-10-CM | POA: Diagnosis not present

## 2022-09-18 DIAGNOSIS — H269 Unspecified cataract: Secondary | ICD-10-CM | POA: Diagnosis not present

## 2022-09-18 DIAGNOSIS — H25812 Combined forms of age-related cataract, left eye: Secondary | ICD-10-CM | POA: Diagnosis not present

## 2022-09-18 DIAGNOSIS — H25811 Combined forms of age-related cataract, right eye: Secondary | ICD-10-CM | POA: Diagnosis not present

## 2022-10-06 DIAGNOSIS — E785 Hyperlipidemia, unspecified: Secondary | ICD-10-CM | POA: Diagnosis not present

## 2022-10-06 DIAGNOSIS — I10 Essential (primary) hypertension: Secondary | ICD-10-CM | POA: Diagnosis not present

## 2022-10-06 DIAGNOSIS — F17210 Nicotine dependence, cigarettes, uncomplicated: Secondary | ICD-10-CM | POA: Diagnosis not present

## 2022-10-06 DIAGNOSIS — Z85038 Personal history of other malignant neoplasm of large intestine: Secondary | ICD-10-CM | POA: Diagnosis not present

## 2022-10-06 DIAGNOSIS — I700 Atherosclerosis of aorta: Secondary | ICD-10-CM | POA: Diagnosis not present

## 2022-10-09 DIAGNOSIS — M25561 Pain in right knee: Secondary | ICD-10-CM | POA: Diagnosis not present

## 2022-10-09 DIAGNOSIS — M1731 Unilateral post-traumatic osteoarthritis, right knee: Secondary | ICD-10-CM | POA: Diagnosis not present

## 2022-10-09 DIAGNOSIS — G8929 Other chronic pain: Secondary | ICD-10-CM | POA: Diagnosis not present

## 2022-10-12 DIAGNOSIS — R5383 Other fatigue: Secondary | ICD-10-CM | POA: Diagnosis not present

## 2022-10-12 DIAGNOSIS — Z20822 Contact with and (suspected) exposure to covid-19: Secondary | ICD-10-CM | POA: Diagnosis not present

## 2022-10-14 DIAGNOSIS — Z20822 Contact with and (suspected) exposure to covid-19: Secondary | ICD-10-CM | POA: Diagnosis not present

## 2022-10-14 DIAGNOSIS — R5383 Other fatigue: Secondary | ICD-10-CM | POA: Diagnosis not present

## 2022-10-16 DIAGNOSIS — H25811 Combined forms of age-related cataract, right eye: Secondary | ICD-10-CM | POA: Diagnosis not present

## 2022-10-16 DIAGNOSIS — H269 Unspecified cataract: Secondary | ICD-10-CM | POA: Diagnosis not present

## 2022-10-16 DIAGNOSIS — H25812 Combined forms of age-related cataract, left eye: Secondary | ICD-10-CM | POA: Diagnosis not present

## 2022-10-17 DIAGNOSIS — Z20822 Contact with and (suspected) exposure to covid-19: Secondary | ICD-10-CM | POA: Diagnosis not present

## 2022-10-17 DIAGNOSIS — R5383 Other fatigue: Secondary | ICD-10-CM | POA: Diagnosis not present

## 2022-10-19 DIAGNOSIS — E876 Hypokalemia: Secondary | ICD-10-CM | POA: Diagnosis not present

## 2022-10-20 DIAGNOSIS — R5383 Other fatigue: Secondary | ICD-10-CM | POA: Diagnosis not present

## 2022-10-20 DIAGNOSIS — Z20822 Contact with and (suspected) exposure to covid-19: Secondary | ICD-10-CM | POA: Diagnosis not present

## 2022-11-01 DIAGNOSIS — M1711 Unilateral primary osteoarthritis, right knee: Secondary | ICD-10-CM | POA: Diagnosis not present

## 2022-11-01 DIAGNOSIS — M25561 Pain in right knee: Secondary | ICD-10-CM | POA: Diagnosis not present

## 2022-11-01 DIAGNOSIS — R739 Hyperglycemia, unspecified: Secondary | ICD-10-CM | POA: Diagnosis not present

## 2022-11-01 DIAGNOSIS — G8929 Other chronic pain: Secondary | ICD-10-CM | POA: Diagnosis not present

## 2022-11-02 DIAGNOSIS — Z20822 Contact with and (suspected) exposure to covid-19: Secondary | ICD-10-CM | POA: Diagnosis not present

## 2022-11-02 DIAGNOSIS — R5383 Other fatigue: Secondary | ICD-10-CM | POA: Diagnosis not present

## 2022-11-04 DIAGNOSIS — R5383 Other fatigue: Secondary | ICD-10-CM | POA: Diagnosis not present

## 2022-11-04 DIAGNOSIS — Z20822 Contact with and (suspected) exposure to covid-19: Secondary | ICD-10-CM | POA: Diagnosis not present

## 2022-11-05 DIAGNOSIS — J0100 Acute maxillary sinusitis, unspecified: Secondary | ICD-10-CM | POA: Diagnosis not present

## 2022-11-05 DIAGNOSIS — R519 Headache, unspecified: Secondary | ICD-10-CM | POA: Diagnosis not present

## 2022-11-05 DIAGNOSIS — R0981 Nasal congestion: Secondary | ICD-10-CM | POA: Diagnosis not present

## 2022-11-27 ENCOUNTER — Other Ambulatory Visit: Payer: Self-pay | Admitting: Orthopedic Surgery

## 2022-11-29 DIAGNOSIS — Z0100 Encounter for examination of eyes and vision without abnormal findings: Secondary | ICD-10-CM | POA: Diagnosis not present

## 2022-12-08 ENCOUNTER — Telehealth: Payer: Self-pay | Admitting: *Deleted

## 2022-12-08 ENCOUNTER — Other Ambulatory Visit: Payer: Self-pay

## 2022-12-08 ENCOUNTER — Telehealth: Payer: Self-pay | Admitting: Urgent Care

## 2022-12-08 ENCOUNTER — Encounter
Admission: RE | Admit: 2022-12-08 | Discharge: 2022-12-08 | Disposition: A | Discharge status: Home / Self Care | Payer: Medicare HMO | Source: Ambulatory Visit | Attending: Orthopedic Surgery | Admitting: Orthopedic Surgery

## 2022-12-08 ENCOUNTER — Inpatient Hospital Stay: Admission: RE | Admit: 2022-12-08 | Payer: Medicare HMO | Source: Ambulatory Visit

## 2022-12-08 VITALS — BP 134/96 | HR 94 | Resp 16 | Ht 62.0 in | Wt 160.9 lb

## 2022-12-08 DIAGNOSIS — Z01812 Encounter for preprocedural laboratory examination: Secondary | ICD-10-CM

## 2022-12-08 DIAGNOSIS — R9431 Abnormal electrocardiogram [ECG] [EKG]: Secondary | ICD-10-CM | POA: Insufficient documentation

## 2022-12-08 DIAGNOSIS — Z01818 Encounter for other preprocedural examination: Secondary | ICD-10-CM | POA: Diagnosis not present

## 2022-12-08 DIAGNOSIS — E876 Hypokalemia: Secondary | ICD-10-CM

## 2022-12-08 DIAGNOSIS — Z01810 Encounter for preprocedural cardiovascular examination: Secondary | ICD-10-CM | POA: Diagnosis not present

## 2022-12-08 DIAGNOSIS — Z79899 Other long term (current) drug therapy: Secondary | ICD-10-CM

## 2022-12-08 HISTORY — DX: Prediabetes: R73.03

## 2022-12-08 HISTORY — DX: Malignant (primary) neoplasm, unspecified: C80.1

## 2022-12-08 HISTORY — DX: Anemia, unspecified: D64.9

## 2022-12-08 HISTORY — DX: Other complications of anesthesia, initial encounter: T88.59XA

## 2022-12-08 HISTORY — DX: Essential (primary) hypertension: I10

## 2022-12-08 HISTORY — DX: Anxiety disorder, unspecified: F41.9

## 2022-12-08 LAB — CBC WITH DIFFERENTIAL/PLATELET
Abs Immature Granulocytes: 0.03 10*3/uL (ref 0.00–0.07)
Basophils Absolute: 0.1 10*3/uL (ref 0.0–0.1)
Basophils Relative: 1 %
Eosinophils Absolute: 0.5 10*3/uL (ref 0.0–0.5)
Eosinophils Relative: 5 %
HCT: 41.7 % (ref 36.0–46.0)
Hemoglobin: 14.6 g/dL (ref 12.0–15.0)
Immature Granulocytes: 0 %
Lymphocytes Relative: 38 %
Lymphs Abs: 3.7 10*3/uL (ref 0.7–4.0)
MCH: 32.4 pg (ref 26.0–34.0)
MCHC: 35 g/dL (ref 30.0–36.0)
MCV: 92.5 fL (ref 80.0–100.0)
Monocytes Absolute: 0.7 10*3/uL (ref 0.1–1.0)
Monocytes Relative: 7 %
Neutro Abs: 4.8 10*3/uL (ref 1.7–7.7)
Neutrophils Relative %: 49 %
Platelets: 264 10*3/uL (ref 150–400)
RBC: 4.51 MIL/uL (ref 3.87–5.11)
RDW: 13.2 % (ref 11.5–15.5)
WBC: 9.8 10*3/uL (ref 4.0–10.5)
nRBC: 0 % (ref 0.0–0.2)

## 2022-12-08 LAB — TYPE AND SCREEN
ABO/RH(D): A POS
Antibody Screen: NEGATIVE

## 2022-12-08 LAB — COMPREHENSIVE METABOLIC PANEL
ALT: 32 U/L (ref 0–44)
AST: 31 U/L (ref 15–41)
Albumin: 4.4 g/dL (ref 3.5–5.0)
Alkaline Phosphatase: 72 U/L (ref 38–126)
Anion gap: 10 (ref 5–15)
BUN: 13 mg/dL (ref 8–23)
CO2: 29 mmol/L (ref 22–32)
Calcium: 9.2 mg/dL (ref 8.9–10.3)
Chloride: 102 mmol/L (ref 98–111)
Creatinine, Ser: 0.7 mg/dL (ref 0.44–1.00)
GFR, Estimated: >60 mL/min (ref >60)
Glucose, Bld: 86 mg/dL (ref 70–99)
Potassium: 2.9 mmol/L — ABNORMAL LOW (ref 3.5–5.1)
Sodium: 141 mmol/L (ref 135–145)
Total Bilirubin: 1.2 mg/dL (ref 0.3–1.2)
Total Protein: 7.3 g/dL (ref 6.5–8.1)

## 2022-12-08 LAB — URINALYSIS, ROUTINE W REFLEX MICROSCOPIC
Bacteria, UA: NONE SEEN
Bilirubin Urine: NEGATIVE
Glucose, UA: NEGATIVE mg/dL
Hgb urine dipstick: NEGATIVE
Ketones, ur: NEGATIVE mg/dL
Nitrite: NEGATIVE
Protein, ur: NEGATIVE mg/dL
Specific Gravity, Urine: 1.006 (ref 1.005–1.030)
pH: 6 (ref 5.0–8.0)

## 2022-12-08 LAB — SURGICAL PCR SCREEN
MRSA, PCR: NEGATIVE
Staphylococcus aureus: NEGATIVE

## 2022-12-08 MED ORDER — POTASSIUM CHLORIDE CRYS ER 20 MEQ PO TBCR
EXTENDED_RELEASE_TABLET | ORAL | 0 refills | Status: DC
Start: 2022-12-08 — End: 2023-08-10

## 2022-12-08 NOTE — Patient Instructions (Addendum)
Your procedure is scheduled on: 12/21/22 - Thursday Report to the Registration Desk on the 1st floor of the [facility]. To find out your arrival time, please call [phone] between 1PM - 3PM on: 12/20/22 - Wednesday If your arrival time is 6:00 am, do not arrive before that time as the [facility] entrance doors do not open until 6:00 am.  REMEMBER: Instructions that are not followed completely may result in serious medical risk, up to and including death; or upon the discretion of your surgeon and anesthesiologist your surgery may need to be rescheduled.  Do not eat food after midnight the night before surgery.  No gum chewing or hard candies.  You may however, drink CLEAR liquids up to 2 hours before you are scheduled to arrive for your surgery. Do not drink anything within 2 hours of your scheduled arrival time.  Clear liquids include: - water  - apple juice without pulp - gatorade (not RED colors) - black coffee or tea (Do NOT add milk or creamers to the coffee or tea) Do NOT drink anything that is not on this list.  In addition, your doctor has ordered for you to drink the provided:  Ensure Pre-Surgery Clear Carbohydrate Drink  Drinking this carbohydrate drink up to two hours before surgery helps to reduce insulin resistance and improve patient outcomes. Please complete drinking 2 hours before scheduled arrival time.  One week prior to surgery: Stop taking on 12/14/22, Anti-inflammatories (NSAIDS) such as Advil, Aleve, Ibuprofen, Motrin, Naproxen, Naprosyn and Aspirin based products such as Excedrin, Goody's Powder, BC Powder.  Stop taking beginning 12/14/22 ANY OVER THE COUNTER supplements until after surgery: Multivitamin  You may take Tylenol if needed for pain up until the day of surgery.  TAKE ONLY THESE MEDICATIONS THE MORNING OF SURGERY WITH A SIP OF WATER:  omeprazole (PRILOSEC) - (take one the night before and one on the morning of surgery - helps to prevent  nausea after surgery.) fluticasone (FLONASE)  potassium chloride (KLOR-CON M)    No Alcohol for 24 hours before or after surgery.  No Smoking including e-cigarettes for 24 hours before surgery.  No chewable tobacco products for at least 6 hours before surgery.  No nicotine patches on the day of surgery.  Do not use any "recreational" drugs for at least a week (preferably 2 weeks) before your surgery.  Please be advised that the combination of cocaine and anesthesia may have negative outcomes, up to and including death. If you test positive for cocaine, your surgery will be cancelled.  On the morning of surgery brush your teeth with toothpaste and water, you may rinse your mouth with mouthwash if you wish.  Do not swallow any toothpaste or mouthwash.  Use CHG Soap or wipes as directed on instruction sheet.  Do not wear jewelry, make-up, hairpins, clips or nail polish.  Do not wear lotions, powders, or perfumes.   Contact lenses, hearing aids and dentures may not be worn into surgery.  Do not bring valuables to the hospital. [facility] is not responsible for any missing/lost belongings or valuables.   Notify your doctor if there is any change in your medical condition (cold, fever, infection).  Wear comfortable clothing (specific to your surgery type) to the hospital.  After surgery, you can help prevent lung complications by doing breathing exercises.  Take deep breaths and cough every 1-2 hours. Your doctor may order a device called an Incentive Spirometer to help you take deep breaths. When coughing  or sneezing, hold a pillow firmly against your incision with both hands. This is called "splinting." Doing this helps protect your incision. It also decreases belly discomfort.  If you are being admitted to the hospital overnight, leave your suitcase in the car. After surgery it may be brought to your room.  In case of increased patient census, it may be necessary for you, the  patient, to continue your postoperative care in the Same Day Surgery department.  If you are being discharged the day of surgery, you will not be allowed to drive home. You will need a responsible individual to drive you home and stay with you for 24 hours after surgery.   If you are taking public transportation, you will need to have a responsible individual with you.  Please call the Pre-admissions Testing Dept. at [phone] if you have any questions about these instructions.  Surgery Visitation Policy:  Patients having surgery or a procedure may have two visitors.  Children under the age of 12 must have an adult with them who is not the patient.  Inpatient Visitation:    Visiting hours are 7 a.m. to 8 p.m. Up to four visitors are allowed at one time in a patient room. The visitors may rotate out with other people during the day.  One visitor age 13 or older may stay with the patient overnight and must be in the room by 8 p.m. How to Use an Incentive Spirometer  An incentive spirometer is a tool that measures how well you are filling your lungs with each breath. Learning to take long, deep breaths using this tool can help you keep your lungs clear and active. This may help to reverse or lessen your chance of developing breathing (pulmonary) problems, especially infection. You may be asked to use a spirometer: After a surgery. If you have a lung problem or a history of smoking. After a long period of time when you have been unable to move or be active. If the spirometer includes an indicator to show the highest number that you have reached, your health care provider or respiratory therapist will help you set a goal. Keep a log of your progress as told by your health care provider. What are the risks? Breathing too quickly may cause dizziness or cause you to pass out. Take your time so you do not get dizzy or light-headed. If you are in pain, you may need to take pain medicine before  doing incentive spirometry. It is harder to take a deep breath if you are having pain. How to use your incentive spirometer  Sit up on the edge of your bed or on a chair. Hold the incentive spirometer so that it is in an upright position. Before you use the spirometer, breathe out normally. Place the mouthpiece in your mouth. Make sure your lips are closed tightly around it. Breathe in slowly and as deeply as you can through your mouth, causing the piston or the ball to rise toward the top of the chamber. Hold your breath for 3-5 seconds, or for as long as possible. If the spirometer includes a coach indicator, use this to guide you in breathing. Slow down your breathing if the indicator goes above the marked areas. Remove the mouthpiece from your mouth and breathe out normally. The piston or ball will return to the bottom of the chamber. Rest for a few seconds, then repeat the steps 10 or more times. Take your time and take a few  normal breaths between deep breaths so that you do not get dizzy or light-headed. Do this every 1-2 hours when you are awake. If the spirometer includes a goal marker to show the highest number you have reached (best effort), use this as a goal to work toward during each repetition. After each set of 10 deep breaths, cough a few times. This will help to make sure that your lungs are clear. If you have an incision on your chest or abdomen from surgery, place a pillow or a rolled-up towel firmly against the incision when you cough. This can help to reduce pain while taking deep breaths and coughing. General tips When you are able to get out of bed: Walk around often. Continue to take deep breaths and cough in order to clear your lungs. Keep using the incentive spirometer until your health care provider says it is okay to stop using it. If you have been in the hospital, you may be told to keep using the spirometer at home. Contact a health care provider if: You are  having difficulty using the spirometer. You have trouble using the spirometer as often as instructed. Your pain medicine is not giving enough relief for you to use the spirometer as told. You have a fever. Get help right away if: You develop shortness of breath. You develop a cough with bloody mucus from the lungs. You have fluid or blood coming from an incision site after you cough. Summary An incentive spirometer is a tool that can help you learn to take long, deep breaths to keep your lungs clear and active. You may be asked to use a spirometer after a surgery, if you have a lung problem or a history of smoking, or if you have been inactive for a long period of time. Use your incentive spirometer as instructed every 1-2 hours while you are awake. If you have an incision on your chest or abdomen, place a pillow or a rolled-up towel firmly against your incision when you cough. This will help to reduce pain. Get help right away if you have shortness of breath, you cough up bloody mucus, or blood comes from your incision when you cough. This information is not intended to replace advice given to you by your health care provider. Make sure you discuss any questions you have with your health care provider. Document Revised: 10/27/2019 Document Reviewed: 10/27/2019 Elsevier Patient Education  2023 Elsevier Inc.  POLAR CARE INFORMATION  MassAdvertisement.it  How to use [facility]?  YouTube   ShippingScam.co.uk  OPERATING INSTRUCTIONS  Start the product With dry hands, connect the transformer to the electrical connection located on the top of the cooler. Next, plug the transformer into an appropriate electrical outlet. The unit will automatically start running at this point.  To stop the pump, disconnect electrical power.  Unplug to stop the product when not in use. Unplugging the Polar Care unit turns it off. Always unplug immediately after  use. Never leave it plugged in while unattended. Remove pad.    FIRST ADD WATER TO FILL LINE, THEN ICE---Replace ice when existing ice is almost melted  1 Discuss Treatment with your Licensed Health Care Practitioner and Use Only as Prescribed 2 Apply Insulation Barrier & Cold Therapy Pad 3 Check for Moisture 4 Inspect Skin Regularly  Tips and Trouble Shooting Usage Tips 1. Use cubed or chunked ice for optimal performance. 2. It is recommended to drain the Pad between uses. To drain  the pad, hold the Pad upright with the hose pointed toward the ground. Depress the black plunger and allow water to drain out. 3. You may disconnect the Pad from the unit without removing the pad from the affected area by depressing the silver tabs on the hose coupling and gently pulling the hoses apart. The Pad and unit will seal itself and will not leak. Note: Some dripping during release is normal. 4. DO NOT RUN PUMP WITHOUT WATER! The pump in this unit is designed to run with water. Running the unit without water will cause permanent damage to the pump. 5. Unplug unit before removing lid.  TROUBLESHOOTING GUIDE Pump not running, Water not flowing to the pad, Pad is not getting cold 1. Make sure the transformer is plugged into the wall outlet. 2. Confirm that the ice and water are filled to the indicated levels. 3. Make sure there are no kinks in the pad. 4. Gently pull on the blue tube to make sure the tube/pad junction is straight. 5. Remove the pad from the treatment site and ll it while the pad is lying at; then reapply. 6. Confirm that the pad couplings are securely attached to the unit. Listen for the double clicks (Figure 1) to confirm the pad couplings are securely attached.  Leaks    Note: Some condensation on the lines, controller, and pads is unavoidable, especially in warmer climates. 1. If using a [employer] Polar Care Cold Therapy unit with a detachable Cold Therapy Pad, and a leak exists (other than  condensation on the lines) disconnect the pad couplings. Make sure the silver tabs on the couplings are depressed before reconnecting the pad to the pump hose; then confirm both sides of the coupling are properly clicked in. 2. If the coupling continues to leak or a leak is detected in the pad itself, stop using it and call [employer] Customer Care at [phone].  Cleaning After use, empty and dry the unit with a soft cloth. Warm water and mild detergent may be used occasionally to clean the pump and tubes.  WARNING: The Polar Care Cube can be cold enough to cause serious injury, including full skin necrosis. Follow these Operating Instructions, and carefully read the Product Insert (see pouch on side of unit) and the Cold Therapy Pad Fitting Instructions (provided with each Cold Therapy Pad) prior to use.

## 2022-12-08 NOTE — Telephone Encounter (Signed)
Patient called reporting that she is having knee surgery 12/21/22 and went for her pre op today and her K+ is low at 2.9. Dr Graciela Husbands when last seen had put her on potassium pills at her last visit with him , but due to her low results today the pre op nurse called her and said that per Dr Audelia Acton she should call Dr Cathie Hoops to see if she thinks the patient should come in to have an infusion of potassium to bring her level up quicker. Of note, they did increase her orla potassium to twice a day dosing today as well. Please advise. I told her that it would probably be Monday before I can get her an answer as Dr Cathie Hoops is off this afternoon.  Impression and Plan:  Katie Liu found to be HYPOkalemic at 2.9 mmol/L on preoperative labs. She is on thiazide-like diuretic therapy. Additionally, patient takes a daily K+ supplement, however due to pill size and her current K+ level, compliance is questionable, although patient reports that she is taking it. Called patient to discuss results and plans for correction of noted electrolyte derangement. Offered alternative formulation (liquid), however patient refused. Discussed need for increased dose and she verbalized understanding. Patient verbalizes places to call cancer center to discuss a potential IV replacement. She  was advised to discuss oral dosing with cancer center provider, however I think 20 mEq daily is a good place for her to start, thus this is what I will sent in.         Meds ordered this encounter  Medications   potassium chloride SA (KLOR-CON M) 20 MEQ tablet      Sig: Take 3 tablets (60 mEq) today, the 1 tablet (20 mEq) daily until surgery. Be sure to take dose on day of surgery. Follow up with PCP for repeat labs.      Dispense:  30 tablet      Refill:  0      Note dose increase.    Encouraged patient to follow up with PCP about 2-3 weeks postoperatively to have labs rechecked to ensure that levels are remaining within normal range. Will need to  discuss with Dr. Graciela Husbands whether to remain on the 20 mEq daily or reduce back down to 10 mEq based on repeat labs. Discussed nutritional intake of K+ rich foods as an adjunctive way to keep her K+ levels normal; list of K+ rich foods verbally provided. Also mentioned ORS, however advised her not to rely solely on these drinks, as they are high in Na+, and she has a HTN diagnosis.    Will send copy of this note to surgeon and PCP to make them aware of K+ level and plans for correction. Discussed that PCP may elect to pursue a change in diuretic therapy to a K+ sparing type medication, or alternatively, they may consider adding a daily K+ supplement. Order entered to recheck K+ on the day of her surgery to ensure optimization.  Patient verbalized understanding of POC as it stands at this point. Wished patient the best of luck with her upcoming surgery and subsequent recovery. She was encouraged to return call to the PAT clinic, or to her surgeon's office, should any questions or concerns arise between now and the time of her surgery. Patient was appreciative of the care/concern expressed by PAT staff.        Encounter Diagnoses  Name Primary?   Pre-operative laboratory examination Yes   Diuretic-induced hypokalemia  Long term current use of diuretic      [name], MSN, APRN, FNP-C, CEN [facility]  Peri-operative Services Nurse Practitioner Phone: [phone] 12/08/22 3:01 PM

## 2022-12-08 NOTE — Progress Notes (Signed)
[facility] Perioperative Services: Pre-Admission/Anesthesia Testing  Abnormal Lab Notification and Treatment Plan of Care   Date: 12/08/22  Name: Katie Liu MRN:   [id number]  Re: Abnormal labs noted during PAT appointment   Notified:  Provider Name Provider Role Notification Mode  Reinaldo Berber, MD Orthopedics (Surgeon) Routed and/or faxed via Oswaldo Done, Viona Gilmore, MD Primary Care Provider Routed and/or faxed via [facility]   Clinical Information and Notes:  ABNORMAL LAB VALUE(S): Lab Results  Component Value Date   K 2.9 (L) 12/08/2022   Katie Liu is scheduled for an elective RIGHT TOTAL KNEE ARTHROPLASTY on 12/21/2022. In review of her medication reconciliation, it is noted that the patient is taking prescribed diuretic medications (chlorthalidone 25 mg) daily. She has prescribed KDur 10 mEq, however expresses issues without being able to swallow such a large pill. Patient is having to split pill into 4 pieces and take with applesauce.  Please note, in efforts to promote a safe and effective anesthetic course, per current guidelines/standards set by the [facility] anesthesia team, the minimal acceptable K+ level for the patient to proceed with general anesthesia is 3.0 mmol/L. With that being said, if the patient drops any lower, her elective procedure will need to be postponed until K+ is better optimized. In efforts to prevent case cancellation, will make efforts to optimize pre-surgical K+ level so that patient can safely undergo the planned surgical intervention.   Impression and Plan:  Katie Liu found to be HYPOkalemic at 2.9 mmol/L on preoperative labs. She is on thiazide-like diuretic therapy. Additionally, patient takes a daily K+ supplement, however due to pill size and her current K+ level, compliance is questionable, although patient reports that she is taking it. Called patient to discuss results and plans for correction of noted electrolyte  derangement. Offered alternative formulation (liquid), however patient refused. Discussed need for increased dose and she verbalized understanding. Patient verbalizes places to call cancer center to discuss a potential IV replacement. She  was advised to discuss oral dosing with cancer center provider, however I think 20 mEq daily is a good place for her to start, thus this is what I will sent in.    Meds ordered this encounter  Medications   potassium chloride SA (KLOR-CON M) 20 MEQ tablet    Sig: Take 3 tablets (60 mEq) today, the 1 tablet (20 mEq) daily until surgery. Be sure to take dose on day of surgery. Follow up with PCP for repeat labs.    Dispense:  30 tablet    Refill:  0    Note dose increase.   Encouraged patient to follow up with PCP about 2-3 weeks postoperatively to have labs rechecked to ensure that levels are remaining within normal range. Will need to discuss with Dr. Graciela Husbands whether to remain on the 20 mEq daily or reduce back down to 10 mEq based on repeat labs. Discussed nutritional intake of K+ rich foods as an adjunctive way to keep her K+ levels normal; list of K+ rich foods verbally provided. Also mentioned ORS, however advised her not to rely solely on these drinks, as they are high in Na+, and she has a HTN diagnosis.   Will send copy of this note to surgeon and PCP to make them aware of K+ level and plans for correction. Discussed that PCP may elect to pursue a change in diuretic therapy to a K+ sparing type medication, or alternatively, they may consider adding a daily  K+ supplement. Order entered to recheck K+ on the day of her surgery to ensure optimization.  Patient verbalized understanding of POC as it stands at this point. Wished patient the best of luck with her upcoming surgery and subsequent recovery. She was encouraged to return call to the PAT clinic, or to her surgeon's office, should any questions or concerns arise between now and the time of her surgery. Patient  was appreciative of the care/concern expressed by PAT staff.   Encounter Diagnoses  Name Primary?   Pre-operative laboratory examination Yes   Diuretic-induced hypokalemia    Long term current use of diuretic    [name], MSN, APRN, FNP-C, CEN [facility]  Peri-operative Services Nurse Practitioner Phone: [phone] 12/08/22 3:01 PM  NOTE: This note has been prepared using Dragon dictation software. Despite my best ability to proofread, there is always the potential that unintentional transcriptional errors may still occur from this process.

## 2022-12-11 ENCOUNTER — Other Ambulatory Visit: Payer: Self-pay

## 2022-12-11 ENCOUNTER — Telehealth: Payer: Self-pay | Admitting: Urgent Care

## 2022-12-11 DIAGNOSIS — E876 Hypokalemia: Secondary | ICD-10-CM

## 2022-12-11 NOTE — Progress Notes (Signed)
  Perioperative Services Pre-Admission/Anesthesia Testing    Date: 12/11/22  Name: Katie Liu MRN:   [id number]  Re: Preoperative K+ level  Patient is scheduled for an elective RIGHT TOTAL KNEE ARTHROPLASTY on 12/21/2022 with Dr. Reinaldo Berber, MD. in preparation for procedure, patient presented to the PAT clinic on 12/08/2022 for preoperative labs, at which time her K+ level was found to be low at 2.9 mmol/L.  Of note, patient on thiazide like diuretic (chlorthalidone 25 mg) daily, resulting in diuretic induced hypokalemia. Patient was reported to be on supplemental K+, however compliance at that time was questionable given her persistent hypokalemia.    On 12/08/2022, supplemental K+ dose increased in response to preoperative hypokalemia.  Supplement instructions were changed to 60 mEq dose x 1, followed by 20 mEq daily.  Patient had verbalized intentions of speaking with cancer center to discuss parenteral replacement citing that she was having difficulty swallowing the pills. Again, liquid formulation was offered, however patient refused.  Patient was advised to follow-up with prescribing provider, who she reported was Dr. Rickard Patience, MD, 2-3 weeks postoperatively for K+ recheck.  Based on repeat labs, provider with lab advised patient on need to remain on 20 mEq dose versus returning to previously prescribed 10 mEq dose.  Patient returned a call to the PAT clinic on 12/11/2022 with concerns about her potassium being low.  She reports compliance with doses change implemented on 12/08/2022.  60 mEq loading dose taken on 12/08/2022.  Since that time, patient has been on a daily 20 mEq dose.  Patient supplementing with K+ rich foods as recommended.    Patient requesting to return to PAT clinic for repeat K+ level prior to the day of surgery for peace of mind; requesting to come on 12/19/2022.  We are certainly able to oblige patient's request. Order entered for repeat K+ level to be obtained on  12/19/2022 per her request. In the event that K+ remains low, further orders will be provided for preoperative optimization prior to surgery on 12/21/2022.  [name], MSN, APRN, FNP-C, CEN [facility]  Peri-operative Services Nurse Practitioner Phone: [phone] 12/11/22 4:07 PM  NOTE: This note has been prepared using Dragon dictation software. Despite my best ability to proofread, there is always the potential that unintentional transcriptional errors may still occur from this process.

## 2022-12-11 NOTE — Telephone Encounter (Signed)
Per Dr. Cathie Hoops, please schedule pt for lab (K) Katie Liu IV potassium- one day this week.

## 2022-12-12 DIAGNOSIS — M1731 Unilateral post-traumatic osteoarthritis, right knee: Secondary | ICD-10-CM | POA: Diagnosis not present

## 2022-12-19 ENCOUNTER — Encounter
Admission: RE | Admit: 2022-12-19 | Discharge: 2022-12-19 | Disposition: A | Discharge status: Home / Self Care | Payer: Medicare HMO | Source: Ambulatory Visit | Attending: Orthopedic Surgery | Admitting: Orthopedic Surgery

## 2022-12-19 DIAGNOSIS — Z01812 Encounter for preprocedural laboratory examination: Secondary | ICD-10-CM | POA: Diagnosis not present

## 2022-12-19 DIAGNOSIS — E876 Hypokalemia: Secondary | ICD-10-CM

## 2022-12-19 DIAGNOSIS — T502X5A Adverse effect of carbonic-anhydrase inhibitors, benzothiadiazides and other diuretics, initial encounter: Secondary | ICD-10-CM | POA: Diagnosis not present

## 2022-12-19 DIAGNOSIS — Z79899 Other long term (current) drug therapy: Secondary | ICD-10-CM | POA: Insufficient documentation

## 2022-12-19 LAB — POTASSIUM: Potassium: 3.6 mmol/L (ref 3.5–5.1)

## 2022-12-21 ENCOUNTER — Ambulatory Visit: Payer: Medicare HMO

## 2022-12-21 ENCOUNTER — Other Ambulatory Visit: Payer: Self-pay

## 2022-12-21 ENCOUNTER — Ambulatory Visit: Payer: Medicare HMO | Admitting: Urgent Care

## 2022-12-21 ENCOUNTER — Encounter
Admission: RE | Disposition: A | Discharge status: Home Health | Payer: Self-pay | Source: Home / Self Care | Attending: Orthopedic Surgery

## 2022-12-21 ENCOUNTER — Encounter: Payer: Self-pay | Admitting: Orthopedic Surgery

## 2022-12-21 ENCOUNTER — Observation Stay
Admission: RE | Admit: 2022-12-21 | Discharge: 2022-12-22 | Disposition: A | Discharge status: Home Health | Payer: Medicare HMO | Attending: Orthopedic Surgery | Admitting: Orthopedic Surgery

## 2022-12-21 DIAGNOSIS — I10 Essential (primary) hypertension: Secondary | ICD-10-CM | POA: Insufficient documentation

## 2022-12-21 DIAGNOSIS — Z01812 Encounter for preprocedural laboratory examination: Secondary | ICD-10-CM

## 2022-12-21 DIAGNOSIS — Z85038 Personal history of other malignant neoplasm of large intestine: Secondary | ICD-10-CM | POA: Diagnosis not present

## 2022-12-21 DIAGNOSIS — M1731 Unilateral post-traumatic osteoarthritis, right knee: Secondary | ICD-10-CM | POA: Diagnosis not present

## 2022-12-21 DIAGNOSIS — M1711 Unilateral primary osteoarthritis, right knee: Secondary | ICD-10-CM | POA: Diagnosis not present

## 2022-12-21 DIAGNOSIS — E876 Hypokalemia: Secondary | ICD-10-CM

## 2022-12-21 DIAGNOSIS — Z7982 Long term (current) use of aspirin: Secondary | ICD-10-CM | POA: Insufficient documentation

## 2022-12-21 DIAGNOSIS — Z471 Aftercare following joint replacement surgery: Secondary | ICD-10-CM | POA: Diagnosis not present

## 2022-12-21 DIAGNOSIS — Z96651 Presence of right artificial knee joint: Secondary | ICD-10-CM

## 2022-12-21 DIAGNOSIS — Z79899 Other long term (current) drug therapy: Secondary | ICD-10-CM | POA: Diagnosis not present

## 2022-12-21 DIAGNOSIS — Z7952 Long term (current) use of systemic steroids: Secondary | ICD-10-CM | POA: Insufficient documentation

## 2022-12-21 HISTORY — PX: TOTAL KNEE ARTHROPLASTY: SHX125

## 2022-12-21 LAB — GLUCOSE, CAPILLARY
Glucose-Capillary: 148 mg/dL — ABNORMAL HIGH (ref 70–99)
Glucose-Capillary: 52 mg/dL — ABNORMAL LOW (ref 70–99)
Glucose-Capillary: 87 mg/dL (ref 70–99)
Glucose-Capillary: 91 mg/dL (ref 70–99)
Glucose-Capillary: 138 mg/dL — ABNORMAL HIGH (ref 70–99)

## 2022-12-21 LAB — POCT I-STAT, CHEM 8
BUN: 17 mg/dL (ref 8–23)
BUN: 14 mg/dL (ref 8–23)
Calcium, Ion: 1.17 mmol/L (ref 1.15–1.40)
Calcium, Ion: 1.13 mmol/L — ABNORMAL LOW (ref 1.15–1.40)
Chloride: 99 mmol/L (ref 98–111)
Chloride: 102 mmol/L (ref 98–111)
Creatinine, Ser: 0.7 mg/dL (ref 0.44–1.00)
Creatinine, Ser: 0.6 mg/dL (ref 0.44–1.00)
Glucose, Bld: 214 mg/dL — ABNORMAL HIGH (ref 70–99)
Glucose, Bld: 98 mg/dL (ref 70–99)
HCT: 41 % (ref 36.0–46.0)
HCT: 40 % (ref 36.0–46.0)
Hemoglobin: 13.9 g/dL (ref 12.0–15.0)
Hemoglobin: 13.6 g/dL (ref 12.0–15.0)
Potassium: 2.7 mmol/L — CL (ref 3.5–5.1)
Potassium: 3.1 mmol/L — ABNORMAL LOW (ref 3.5–5.1)
Sodium: 139 mmol/L (ref 135–145)
Sodium: 140 mmol/L (ref 135–145)
TCO2: 25 mmol/L (ref 22–32)
TCO2: 28 mmol/L (ref 22–32)

## 2022-12-21 LAB — POTASSIUM
Potassium: 2.7 mmol/L — CL (ref 3.5–5.1)
Potassium: 3 mmol/L — ABNORMAL LOW (ref 3.5–5.1)

## 2022-12-21 SURGERY — ARTHROPLASTY, KNEE, TOTAL
Anesthesia: General | Site: Knee | Laterality: Right

## 2022-12-21 MED ORDER — CEFAZOLIN SODIUM-DEXTROSE 2-4 GM/100ML-% IV SOLN
INTRAVENOUS | Status: AC
  Filled 2022-12-21: qty 100

## 2022-12-21 MED ORDER — TRANEXAMIC ACID 1000 MG/10ML IV SOLN
INTRAVENOUS | Status: AC
  Filled 2022-12-21: qty 10

## 2022-12-21 MED ORDER — SURGIRINSE WOUND IRRIGATION SYSTEM - OPTIME
TOPICAL | Status: DC | PRN
  Administered 2022-12-21: 900 mL via TOPICAL

## 2022-12-21 MED ORDER — BUPIVACAINE HCL (PF) 0.5 % IJ SOLN
INTRAMUSCULAR | Status: AC
  Filled 2022-12-21: qty 10

## 2022-12-21 MED ORDER — FENTANYL CITRATE (PF) 100 MCG/2ML IJ SOLN
INTRAMUSCULAR | Status: DC | PRN
  Administered 2022-12-21: 50 ug via INTRAVENOUS

## 2022-12-21 MED ORDER — FENTANYL CITRATE (PF) 100 MCG/2ML IJ SOLN
INTRAMUSCULAR | Status: AC
  Filled 2022-12-21: qty 2

## 2022-12-21 MED ORDER — ASPIRIN 81 MG PO CHEW
81.0000 mg | CHEWABLE_TABLET | Freq: Every day | ORAL | Status: DC
  Administered 2022-12-21 – 2022-12-22 (×2): 81 mg via ORAL
  Filled 2022-12-21 (×2): qty 1

## 2022-12-21 MED ORDER — CEFAZOLIN SODIUM-DEXTROSE 2-4 GM/100ML-% IV SOLN
2.0000 g | Freq: Four times a day (QID) | INTRAVENOUS | Status: AC
  Administered 2022-12-21 – 2022-12-22 (×2): 2 g via INTRAVENOUS
  Filled 2022-12-21 (×2): qty 100

## 2022-12-21 MED ORDER — 0.9 % SODIUM CHLORIDE (POUR BTL) OPTIME
TOPICAL | Status: DC | PRN
  Administered 2022-12-21: 220 mL

## 2022-12-21 MED ORDER — ACETAMINOPHEN 500 MG PO TABS
1000.0000 mg | ORAL_TABLET | Freq: Three times a day (TID) | ORAL | Status: DC
  Administered 2022-12-21 – 2022-12-22 (×3): 1000 mg via ORAL
  Filled 2022-12-21 (×3): qty 2

## 2022-12-21 MED ORDER — PHENOL 1.4 % MT LIQD: 1.0000 | OROMUCOSAL | Status: DC | PRN

## 2022-12-21 MED ORDER — DEXTROSE 50 % IV SOLN
INTRAVENOUS | Status: AC
  Filled 2022-12-21: qty 50

## 2022-12-21 MED ORDER — CHLORTHALIDONE 25 MG PO TABS
25.0000 mg | ORAL_TABLET | Freq: Every day | ORAL | Status: DC
  Administered 2022-12-22: 25 mg via ORAL
  Filled 2022-12-21: qty 1

## 2022-12-21 MED ORDER — MIDAZOLAM HCL 5 MG/5ML IJ SOLN
INTRAMUSCULAR | Status: DC | PRN
  Administered 2022-12-21 (×2): 1 mg via INTRAVENOUS

## 2022-12-21 MED ORDER — CHLORHEXIDINE GLUCONATE 0.12 % MT SOLN
15.0000 mL | Freq: Once | OROMUCOSAL | Status: AC
  Administered 2022-12-21: 15 mL via OROMUCOSAL

## 2022-12-21 MED ORDER — ATORVASTATIN CALCIUM 20 MG PO TABS
40.0000 mg | ORAL_TABLET | Freq: Every day | ORAL | Status: DC
  Administered 2022-12-21 – 2022-12-22 (×2): 40 mg via ORAL
  Filled 2022-12-21 (×2): qty 2

## 2022-12-21 MED ORDER — ONDANSETRON HCL 4 MG/2ML IJ SOLN: 4.0000 mg | Freq: Four times a day (QID) | INTRAMUSCULAR | Status: DC | PRN

## 2022-12-21 MED ORDER — BUPIVACAINE LIPOSOME 1.3 % IJ SUSP
INTRAMUSCULAR | Status: AC
  Filled 2022-12-21: qty 20

## 2022-12-21 MED ORDER — PROPOFOL 500 MG/50ML IV EMUL
INTRAVENOUS | Status: DC | PRN
  Administered 2022-12-21: 50 ug/kg/min via INTRAVENOUS

## 2022-12-21 MED ORDER — DEXAMETHASONE SODIUM PHOSPHATE 10 MG/ML IJ SOLN
INTRAMUSCULAR | Status: AC
  Filled 2022-12-21: qty 1

## 2022-12-21 MED ORDER — OXYCODONE HCL 5 MG PO TABS: 5.0000 mg | ORAL_TABLET | Freq: Once | ORAL | Status: DC | PRN

## 2022-12-21 MED ORDER — PANTOPRAZOLE SODIUM 40 MG PO TBEC
40.0000 mg | DELAYED_RELEASE_TABLET | Freq: Every day | ORAL | Status: DC
  Administered 2022-12-21 – 2022-12-22 (×2): 40 mg via ORAL
  Filled 2022-12-21 (×2): qty 1

## 2022-12-21 MED ORDER — ONDANSETRON HCL 4 MG/2ML IJ SOLN: 4.0000 mg | Freq: Once | INTRAMUSCULAR | Status: DC | PRN

## 2022-12-21 MED ORDER — DEXTROSE 50 % IV SOLN
25.0000 mL | Freq: Once | INTRAVENOUS | Status: AC
  Administered 2022-12-21: 25 mL via INTRAVENOUS

## 2022-12-21 MED ORDER — LACTATED RINGERS IV SOLN: INTRAVENOUS | Status: DC

## 2022-12-21 MED ORDER — MENTHOL 3 MG MT LOZG: 1.0000 | LOZENGE | OROMUCOSAL | Status: DC | PRN

## 2022-12-21 MED ORDER — ORAL CARE MOUTH RINSE: 15.0000 mL | Freq: Once | OROMUCOSAL | Status: AC

## 2022-12-21 MED ORDER — SODIUM CHLORIDE 0.9 % IV SOLN: INTRAVENOUS | Status: DC

## 2022-12-21 MED ORDER — TRANEXAMIC ACID-NACL 1000-0.7 MG/100ML-% IV SOLN
INTRAVENOUS | Status: AC
  Filled 2022-12-21: qty 100

## 2022-12-21 MED ORDER — METOCLOPRAMIDE HCL 5 MG/ML IJ SOLN: 5.0000 mg | Freq: Three times a day (TID) | INTRAMUSCULAR | Status: DC | PRN

## 2022-12-21 MED ORDER — FENTANYL CITRATE (PF) 100 MCG/2ML IJ SOLN: 25.0000 ug | INTRAMUSCULAR | Status: DC | PRN

## 2022-12-21 MED ORDER — BUPIVACAINE HCL (PF) 0.25 % IJ SOLN
INTRAMUSCULAR | Status: AC
  Filled 2022-12-21: qty 30

## 2022-12-21 MED ORDER — DEXAMETHASONE SODIUM PHOSPHATE 10 MG/ML IJ SOLN
8.0000 mg | Freq: Once | INTRAMUSCULAR | Status: AC
  Administered 2022-12-21: 8 mg via INTRAVENOUS

## 2022-12-21 MED ORDER — DOCUSATE SODIUM 100 MG PO CAPS
100.0000 mg | ORAL_CAPSULE | Freq: Two times a day (BID) | ORAL | Status: DC
  Administered 2022-12-21 – 2022-12-22 (×2): 100 mg via ORAL
  Filled 2022-12-21 (×2): qty 1

## 2022-12-21 MED ORDER — SODIUM CHLORIDE FLUSH 0.9 % IV SOLN
INTRAVENOUS | Status: AC
  Filled 2022-12-21: qty 10

## 2022-12-21 MED ORDER — ENOXAPARIN SODIUM 30 MG/0.3ML IJ SOSY
30.0000 mg | PREFILLED_SYRINGE | Freq: Two times a day (BID) | INTRAMUSCULAR | Status: DC
  Administered 2022-12-22: 30 mg via SUBCUTANEOUS
  Filled 2022-12-21: qty 0.3

## 2022-12-21 MED ORDER — BUPIVACAINE HCL (PF) 0.5 % IJ SOLN
INTRAMUSCULAR | Status: DC | PRN
  Administered 2022-12-21: 2.2 mL

## 2022-12-21 MED ORDER — TRANEXAMIC ACID-NACL 1000-0.7 MG/100ML-% IV SOLN
1000.0000 mg | INTRAVENOUS | Status: AC
  Administered 2022-12-21 (×2): 1000 mg via INTRAVENOUS

## 2022-12-21 MED ORDER — HYDROCODONE-ACETAMINOPHEN 5-325 MG PO TABS
1.0000 | ORAL_TABLET | ORAL | Status: DC | PRN
  Administered 2022-12-21 – 2022-12-22 (×2): 2 via ORAL
  Administered 2022-12-22: 1 via ORAL
  Filled 2022-12-21: qty 1
  Filled 2022-12-21 (×2): qty 2

## 2022-12-21 MED ORDER — INSULIN ASPART 100 UNIT/ML IV SOLN
5.0000 [IU] | Freq: Once | INTRAVENOUS | Status: AC
  Administered 2022-12-21: 5 [IU] via INTRAVENOUS
  Filled 2022-12-21: qty 0.05

## 2022-12-21 MED ORDER — PROPOFOL 1000 MG/100ML IV EMUL
INTRAVENOUS | Status: AC
  Filled 2022-12-21: qty 100

## 2022-12-21 MED ORDER — EPINEPHRINE PF 1 MG/ML IJ SOLN
INTRAMUSCULAR | Status: AC
  Filled 2022-12-21: qty 1

## 2022-12-21 MED ORDER — INSULIN ASPART 100 UNIT/ML IJ SOLN
INTRAMUSCULAR | Status: AC
  Filled 2022-12-21: qty 1

## 2022-12-21 MED ORDER — TRAMADOL HCL 50 MG PO TABS: 50.0000 mg | ORAL_TABLET | Freq: Four times a day (QID) | ORAL | Status: DC | PRN

## 2022-12-21 MED ORDER — SODIUM CHLORIDE FLUSH 0.9 % IV SOLN
INTRAVENOUS | Status: AC
  Filled 2022-12-21: qty 20

## 2022-12-21 MED ORDER — SODIUM CHLORIDE (PF) 0.9 % IJ SOLN
INTRAMUSCULAR | Status: DC | PRN
  Administered 2022-12-21: 71 mL

## 2022-12-21 MED ORDER — MIDAZOLAM HCL 2 MG/2ML IJ SOLN
INTRAMUSCULAR | Status: AC
  Filled 2022-12-21: qty 2

## 2022-12-21 MED ORDER — CEFAZOLIN SODIUM-DEXTROSE 2-4 GM/100ML-% IV SOLN
2.0000 g | INTRAVENOUS | Status: AC
  Administered 2022-12-21: 2 g via INTRAVENOUS

## 2022-12-21 MED ORDER — SERTRALINE HCL 50 MG PO TABS
25.0000 mg | ORAL_TABLET | Freq: Every day | ORAL | Status: DC
  Administered 2022-12-21 – 2022-12-22 (×2): 25 mg via ORAL
  Filled 2022-12-21 (×2): qty 1

## 2022-12-21 MED ORDER — METOCLOPRAMIDE HCL 5 MG PO TABS: 5.0000 mg | ORAL_TABLET | Freq: Three times a day (TID) | ORAL | Status: DC | PRN

## 2022-12-21 MED ORDER — PHENYLEPHRINE HCL (PRESSORS) 10 MG/ML IV SOLN
INTRAVENOUS | Status: DC | PRN
  Administered 2022-12-21: 80 ug via INTRAVENOUS
  Administered 2022-12-21: 40 ug via INTRAVENOUS
  Administered 2022-12-21: 80 ug via INTRAVENOUS
  Administered 2022-12-21: 100 ug via INTRAVENOUS

## 2022-12-21 MED ORDER — MORPHINE SULFATE (PF) 2 MG/ML IV SOLN
0.5000 mg | INTRAVENOUS | Status: DC | PRN
  Administered 2022-12-21: 1 mg via INTRAVENOUS
  Filled 2022-12-21: qty 1

## 2022-12-21 MED ORDER — POTASSIUM CHLORIDE CRYS ER 20 MEQ PO TBCR
20.0000 meq | EXTENDED_RELEASE_TABLET | Freq: Every day | ORAL | Status: DC
  Administered 2022-12-21: 20 meq via ORAL
  Filled 2022-12-21: qty 1

## 2022-12-21 MED ORDER — OXYCODONE HCL 5 MG/5ML PO SOLN: 5.0000 mg | Freq: Once | ORAL | Status: DC | PRN

## 2022-12-21 MED ORDER — KETOROLAC TROMETHAMINE 15 MG/ML IJ SOLN
7.5000 mg | Freq: Four times a day (QID) | INTRAMUSCULAR | Status: AC
  Administered 2022-12-21 – 2022-12-22 (×4): 7.5 mg via INTRAVENOUS
  Filled 2022-12-21 (×4): qty 1

## 2022-12-21 MED ORDER — ACETAMINOPHEN 10 MG/ML IV SOLN: 1000.0000 mg | Freq: Once | INTRAVENOUS | Status: DC | PRN

## 2022-12-21 MED ORDER — ONDANSETRON HCL 4 MG PO TABS: 4.0000 mg | ORAL_TABLET | Freq: Four times a day (QID) | ORAL | Status: DC | PRN

## 2022-12-21 MED ORDER — POTASSIUM CHLORIDE 10 MEQ/100ML IV SOLN
10.0000 meq | INTRAVENOUS | Status: AC
  Administered 2022-12-21 (×2): 10 meq via INTRAVENOUS
  Filled 2022-12-21 (×2): qty 100

## 2022-12-21 MED ORDER — CHLORHEXIDINE GLUCONATE 0.12 % MT SOLN
OROMUCOSAL | Status: AC
  Filled 2022-12-21: qty 15

## 2022-12-21 MED ORDER — SODIUM CHLORIDE 0.9 % IR SOLN
Status: DC | PRN
  Administered 2022-12-21: 3000 mL

## 2022-12-21 SURGICAL SUPPLY — 76 items
ADH SKN CLS APL DERMABOND .7 (GAUZE/BANDAGES/DRESSINGS) ×1
APL PRP STRL LF DISP 70% ISPRP (MISCELLANEOUS) ×2
BLADE PATELLA REAM PILOT HOLE (MISCELLANEOUS) IMPLANT
BLADE SAW 90X13X1.19 OSCILLAT (BLADE) IMPLANT
BLADE SAW SAG 25X90X1.19 (BLADE) ×2 IMPLANT
BLADE SAW SAG 29X58X.64 (BLADE) ×2 IMPLANT
BNDG CMPR 5X62 HK CLSR LF (GAUZE/BANDAGES/DRESSINGS) ×1
BNDG ELASTIC 6INX 5YD STR LF (GAUZE/BANDAGES/DRESSINGS) ×2 IMPLANT
BOWL CEMENT MIX W/ADAPTER (MISCELLANEOUS) ×2 IMPLANT
BSPLAT TIB 5D C 16NT STM RT (Knees) ×1 IMPLANT
CEMENT BONE R 1X40 (Cement) ×4 IMPLANT
CHLORAPREP W/TINT 26 (MISCELLANEOUS) ×4 IMPLANT
COMP FEM CR CEMT STD SZ5 (Joint) ×1 IMPLANT
COMPONENT FEM CR CEMT STD SZ5 (Joint) IMPLANT
COOLER POLAR GLACIER W/PUMP (MISCELLANEOUS) ×2 IMPLANT
DERMABOND ADVANCED .7 DNX12 (GAUZE/BANDAGES/DRESSINGS) ×2 IMPLANT
DRAPE 3/4 80X56 (DRAPES) ×2 IMPLANT
DRSG MEPILEX SACRM 8.7X9.8 (GAUZE/BANDAGES/DRESSINGS) ×2 IMPLANT
DRSG OPSITE POSTOP 4X10 (GAUZE/BANDAGES/DRESSINGS) IMPLANT
DRSG OPSITE POSTOP 4X8 (GAUZE/BANDAGES/DRESSINGS) IMPLANT
ELECT REM PT RETURN 9FT ADLT (ELECTROSURGICAL) ×1
ELECTRODE REM PT RTRN 9FT ADLT (ELECTROSURGICAL) ×2 IMPLANT
GLOVE BIO SURGEON STRL SZ8 (GLOVE) ×2 IMPLANT
GLOVE BIOGEL PI IND STRL 8 (GLOVE) ×2 IMPLANT
GLOVE PI ORTHO PRO STRL 7.5 (GLOVE) ×4 IMPLANT
GLOVE PI ORTHO PRO STRL SZ8 (GLOVE) ×4 IMPLANT
GOWN SRG XL LVL 3 NONREINFORCE (GOWNS) ×2 IMPLANT
GOWN STRL NON-REIN TWL XL LVL3 (GOWNS) ×1
GOWN STRL REUS W/ TWL LRG LVL3 (GOWN DISPOSABLE) ×2 IMPLANT
GOWN STRL REUS W/ TWL XL LVL3 (GOWN DISPOSABLE) ×2 IMPLANT
GOWN STRL REUS W/TWL LRG LVL3 (GOWN DISPOSABLE) ×1
GOWN STRL REUS W/TWL XL LVL3 (GOWN DISPOSABLE) ×1
HANDLE YANKAUER SUCT OPEN TIP (MISCELLANEOUS) ×2 IMPLANT
HDLS TROCR DRIL PIN KNEE 75 (PIN) ×1
HOOD PEEL AWAY T7 (MISCELLANEOUS) ×6 IMPLANT
IMPL ASF RT PSN 4-5/CD 10 (Joint) IMPLANT
IMPLANT ASF RT PSN 4-5/CD 10 (Joint) ×1 IMPLANT
IV NS IRRIG 3000ML ARTHROMATIC (IV SOLUTION) ×2 IMPLANT
KIT TURNOVER KIT A (KITS) ×2 IMPLANT
MANIFOLD NEPTUNE II (INSTRUMENTS) ×2 IMPLANT
MARKER SKIN DUAL TIP RULER LAB (MISCELLANEOUS) ×2 IMPLANT
MAT ABSORB  FLUID 56X50 GRAY (MISCELLANEOUS) ×1
MAT ABSORB FLUID 56X50 GRAY (MISCELLANEOUS) ×2 IMPLANT
NDL HYPO 21X1.5 SAFETY (NEEDLE) ×2 IMPLANT
NEEDLE HYPO 21X1.5 SAFETY (NEEDLE) ×1 IMPLANT
NS IRRIG 500ML POUR BTL (IV SOLUTION) ×2 IMPLANT
PACK TOTAL KNEE (MISCELLANEOUS) ×2 IMPLANT
PAD ARMBOARD 7.5X6 YLW CONV (MISCELLANEOUS) ×2 IMPLANT
PAD WRAPON POLAR KNEE (MISCELLANEOUS) ×2 IMPLANT
PENCIL SMOKE EVACUATOR (MISCELLANEOUS) ×2 IMPLANT
PIN DRILL HDLS TROCAR 75 4PK (PIN) IMPLANT
PULSAVAC PLUS IRRIG FAN TIP (DISPOSABLE) ×1
SCREW FEMALE HEX FIX 25X2.5 (ORTHOPEDIC DISPOSABLE SUPPLIES) IMPLANT
SCREW HEX HEADED 3.5X27 DISP (ORTHOPEDIC DISPOSABLE SUPPLIES) IMPLANT
SLEEVE SCD COMPRESS KNEE MED (STOCKING) ×2 IMPLANT
SOLUTION IRRIG SURGIPHOR (IV SOLUTION) ×2 IMPLANT
STEM POLY PAT PLY 32M KNEE (Knees) IMPLANT
STEM TIB ST PERS 14+30 (Stem) IMPLANT
STEM TIBIA 5 DEG SZ C R KNEE (Knees) IMPLANT
SUT DVC 2 QUILL PDO  T11 36X36 (SUTURE) ×1
SUT DVC 2 QUILL PDO T11 36X36 (SUTURE) ×2 IMPLANT
SUT QUILL MONODERM 3-0 PS-2 (SUTURE) ×2 IMPLANT
SUT VIC AB 0 CT1 36 (SUTURE) ×2 IMPLANT
SUT VIC AB 2-0 CT2 27 (SUTURE) ×4 IMPLANT
SUT VICRYL 1-0 27IN ABS (SUTURE) ×1
SUTURE VICRYL 1-0 27IN ABS (SUTURE) ×2 IMPLANT
SYR 10ML LL (SYRINGE) ×2 IMPLANT
SYR 30ML LL (SYRINGE) ×4 IMPLANT
SYR 3ML LL SCALE MARK (SYRINGE) ×2 IMPLANT
TAPE CLOTH 3X10 WHT NS LF (GAUZE/BANDAGES/DRESSINGS) ×2 IMPLANT
TIBIA STEM 5 DEG SZ C R KNEE (Knees) ×1 IMPLANT
TIP FAN IRRIG PULSAVAC PLUS (DISPOSABLE) ×2 IMPLANT
TOWEL OR 17X26 4PK STRL BLUE (TOWEL DISPOSABLE) IMPLANT
TRAP FLUID SMOKE EVACUATOR (MISCELLANEOUS) ×2 IMPLANT
WATER STERILE IRR 1000ML POUR (IV SOLUTION) ×2 IMPLANT
WRAPON POLAR PAD KNEE (MISCELLANEOUS) ×1

## 2022-12-21 NOTE — Op Note (Signed)
Patient Name: Katie Liu  WJX:[id number]  Pre-Operative Diagnosis: Right knee Osteoarthritis  Post-Operative Diagnosis: (same)  Procedure: Right Total Knee Arthroplasty  Components/Implants: Femur: Persona #5 Std CR   Tibia: Persona Size C w/ 14x94mm stem extension  Poly: 10mm MC  Patella: 32mm x8.66mm symmetric button  Femoral Valgus Cut Angle: 5 degrees  Distal Femoral Re-cut: none  Patella Resurfacing: Yes   Date of Surgery: 12/21/2022  Surgeon: Reinaldo Berber MD  Assistant: RNFA Patience Cheree Ditto (present and scrubbed throughout the case, critical for assistance with exposure, retraction, instrumentation, and closure), Aya Solis PAS   Anesthesiologist: Mazzoni  Anesthesia: Spinal   Tourniquet Time: 71 min  EBL: 50cc  IVF: 1000cc  Complications: None   Brief history: The patient is a 72 year old female with a history of osteoarthritis of the right knee with pain limiting their range of motion and activities of daily living, which has failed multiple attempts at conservative therapy.  The risks and benefits of total knee arthroplasty as definitive surgical treatment were discussed with the patient, who opted to proceed with the operation.  After outpatient medical clearance and optimization was completed the patient was admitted to [facility] for the procedure.  All preoperative films were reviewed and an appropriate surgical plan was made prior to surgery. Preoperative range of motion was 5 to 115 with a 5 degree flexion contracture.   Description of procedure: The patient was brought to the operating room where laterality was confirmed by all those present to be the right side.   Spinal anesthesia was administered and the patient received an intravenous dose of antibiotics for surgical prophylaxis and a dose of tranexamic acid.  Patient is positioned supine on the operating room table with all bony prominences well-padded.  A well-padded tourniquet  was applied to the right thigh.  The knee was then prepped and draped in usual sterile fashion with multiple layers of adhesive and nonadhesive drapes.  All of those present in the operating room participated in a surgical timeout laterality and patient were confirmed.   An Esmarch was wrapped around the extremity and the leg was elevated and the knee flexed.  The tourniquet was inflated to a pressure of 275 mmHg. The Esmarch was removed and the leg was brought down to full extension.  The patella and tibial tubercle identified and outlined using a marking pen and a midline skin incision was made with a knife carried through the subcutaneous tissue down to the extensor retinaculum.  After exposure of the extensor mechanism the medial parapatellar arthrotomy was performed with a scalpel and electrocautery extending down medial and distal to the tibial tubercle taking care to avoid incising the patellar tendon.   A standard medial release was performed over the proximal tibia.  The knee was brought into extension in order to excise the fat pad taking care not to damage the patella tendon.  The superior soft tissue was removed from the anterior surface of the distal femur to visualize for the procedure.  The knee was then brought into flexion with the patella subluxed laterally and subluxing the tibia anteriorly.  The ACL was transected and removed with electrocautery and additional soft tissue was removed from the proximal surface of the tibia to fully expose. The PCL was found to be intact and was preserved.  An extramedullary tibial cutting guide was then applied to the leg with a spring-loaded ankle clamp placed around the distal tibia just above the malleoli the angulation of the guide was  adjusted to give some posterior slope in the tibial resection with an appropriate varus/valgus alignment.  The resection guide was then pinned to the proximal tibia and the proximal tibial surface was resected with an  oscillating saw.  Careful attention was paid to ensure the blade did not disrupt any of the soft tissues including any lateral or medial ligament.  Attention was then turned to the femur, with the knee slightly flexed a opening drill was used to enter the medullary canal of the femur.  After removing the drill marrow was suctioned out to decompress the distal femur.  An intramedullary femoral guide was then inserted into the drill hole and the alignment guide was seated firmly against the distal end of the medial femoral condyle.  The distal femoral cutting guide was then attached and pinned securely to the anterior surface of the femur and the intramedullary rod and alignment guide was removed.  Distal femur resection was then performed with an oscillating saw with retractors protecting medial and laterally.   The distal cutting block was then removed and the extension gap was checked with a spacer.  Extension gap was found to be appropriately sized to accommodate the spacer block.  The femoral sizing guide was then placed securely into the posterior condyles of the femur and the femoral size was measured and determined to be 5.  The size 5; 4-in-1 cutting guide was placed in position and secured with 2 pins.  The anterior posterior and chamfer resections were then performed with an oscillating saw.  Bony fragments and osteophytes were then removed.  Using a lamina spreader the posterior medial and lateral condyles were checked for additional osteophytes and posterior soft tissue remnants.  Any remaining meniscus was removed at this time.  Periarticular injection was performed in the meniscal rims and posterior capsule with aspiration performed to ensure no intravascular injection.   The tibia was then exposed and the tibial trial was pinned onto the plateau after confirming appropriate orientation and rotation.  Using the drill bushing the tibia was prepared to the appropriate drill depth.  Tibial broach  impactor was then driven through the punch guide using a mallet.  The femoral trial component was then inserted onto the femur.  A trial tibial polyethylene bearing was then placed and the knee was reduced.  The knee achieved full extension with no hyperextension and was found to be balanced in flexion and extension with the trials in place.  The knee was then brought into full extension the patella was everted and held with 2 Kocher clamps.  The articular surface of the patella was then resected with an patella reamer and saw after careful measurement with a caliper.  The patella was then prepared with the drill guide and a trial patella was placed.  The knee was then taken through range of motion and it was found that the patella articulated appropriately with the trochlea and good patellofemoral motion without subluxation.    The correct final components for implantation were confirmed and opened by the circulator nurse.  The prepared surfaces of the patella femur and tibia were cleaned with pulsatile lavage to remove all blood fat and other material and then the surfaces were dried.  2 bags of cement were mixed under vacuum and the components were cemented into place.  Excess cement was removed with curettes and forceps. A trial polyethylene tibial component was placed and the knee was brought into extension to allow the cement to set.  At this  time the periarticular injection cocktail was placed in the soft tissues surrounding the knee.  After full curing of the cement the balance of the knee was checked again and the final polyethylene size was confirmed. The tibial component was irrigated and locking mechanism checked to ensure it was clear of debris. The real polyethylene tibial component was implanted and the knee was brought through a range of motion.   The knee was then irrigated with copious amount of normal saline via pulsatile lavage to remove all loose bodies and other debris.  The knee was then  irrigated with surgiphor betadine based wash and reirrigated with saline.  The tourniquet was then dropped and all bleeding vessels were identified and coagulated.  The arthrotomy was approximated with #1 Vicryl and closed with #2 Quill suture.  The knee was brought into slight flexion and the subcutaneous tissues were closed with 0 Vicryl, 2-0 Vicryl and a running subcuticular 3-0 monoderm quil suture.  Skin was then glued with Dermabond.  A sterile adhesive dressing was then placed along with a sequential compression device to the calf, a Ted stocking, and a cryotherapy cuff.   Sponge, needle, and Lap counts were all correct at the end of the case.   The patient was transferred off of the operating room table to a hospital bed, good pulses were found distally on the operative side.  The patient was transferred to the recovery room in stable condition.

## 2022-12-21 NOTE — Anesthesia Procedure Notes (Signed)
Spinal  Patient location during procedure: OR Start time: 12/21/2022 12:19 PM End time: 12/21/2022 12:25 PM Reason for block: surgical anesthesia Staffing Anesthesiologist: Reed Breech, MD Resident/CRNA: Berniece Pap, CRNA Performed by: Berniece Pap, CRNA Authorized by: Reed Breech, MD   Preanesthetic Checklist Completed: patient identified, IV checked, site marked, risks and benefits discussed, surgical consent, monitors and equipment checked, pre-op evaluation and timeout performed Spinal Block Patient position: sitting Prep: DuraPrep Patient monitoring: heart rate, cardiac monitor, continuous pulse ox and blood pressure Approach: midline Location: L3-4 Injection technique: single-shot Needle Needle type: Sprotte  Needle gauge: 24 G Needle length: 9 cm Assessment Sensory level: T4 Events: CSF return

## 2022-12-21 NOTE — Transfer of Care (Signed)
Immediate Anesthesia Transfer of Care Note  Patient: Katie Liu  Procedure(s) Performed: TOTAL KNEE ARTHROPLASTY (Right: Knee)  Patient Location: PACU  Anesthesia Type:General  Level of Consciousness: awake, alert , and oriented  Airway & Oxygen Therapy: Patient Spontanous Breathing and Patient connected to face mask oxygen  Post-op Assessment: Report given to RN and Post -op Vital signs reviewed and stable  Post vital signs: Reviewed and stable  Last Vitals:  Vitals Value Taken Time  BP 97/62 12/21/22 1454  Temp    Pulse 89 12/21/22 1457  Resp 21 12/21/22 1457  SpO2 95 % 12/21/22 1457  Vitals shown include unvalidated device data.  Last Pain:  Vitals:   12/21/22 0652  PainSc: 8          Complications: No notable events documented.

## 2022-12-21 NOTE — TOC Progression Note (Signed)
Transition of Care [facility]) - Progression Note    Patient Details  Name: Katie Liu MRN: [id number] Date of Birth: 1950-10-13  Transition of Care [facility]) CM/SW Contact  Marlowe Sax, RN Phone Number: 12/21/2022, 3:26 PM  Clinical Narrative:      The patient is set up with [facility] for [facility] prior to surgery by Surgeons office PT to eval, TOC to monitor and will asssit with needs      Expected Discharge Plan and Services                                               Social Determinants of Health (SDOH) Interventions SDOH Screenings   Tobacco Use: High Risk (12/21/2022)    Readmission Risk Interventions     No data to display

## 2022-12-21 NOTE — Progress Notes (Signed)
PT Cancellation Note  Patient Details Name: DELAYLA HOFFMASTER MRN: [id number] DOB: 08/13/1951   Cancelled Treatment:    Reason Eval/Treat Not Completed: Patient at procedure or test/unavailable. Orders received and chart reviewed. Per PACU pt's surgery delayed due to low potassium. Scheduled to be complete estimated around 3:00 p.m. PT will re-attempt tomorrow for evaluation and treatment.    Delphia Grates. Fairly IV, PT, DPT Physical Therapist- [place]  [facility]  12/21/2022, 2:33 PM

## 2022-12-21 NOTE — Anesthesia Preprocedure Evaluation (Addendum)
Anesthesia Evaluation  Patient identified by MRN, date of birth, ID band Patient awake    Reviewed: Allergy & Precautions, NPO status , Patient's Chart, lab work & pertinent test results  History of Anesthesia Complications Negative for: history of anesthetic complications  Airway Mallampati: II   Neck ROM: Full    Dental  (+) Missing   Pulmonary former smoker (quit 6 weeks ago)   Pulmonary exam normal breath sounds clear to auscultation       Cardiovascular hypertension, Normal cardiovascular exam Rhythm:Regular Rate:Normal  ECG 12/08/22: Normal sinus rhythm Nonspecific ST abnormality   Neuro/Psych  PSYCHIATRIC DISORDERS Anxiety     negative neurological ROS     GI/Hepatic ,GERD  ,,Colon CA   Endo/Other  Prediabetes   Renal/GU negative Renal ROS     Musculoskeletal  (+) Arthritis ,    Abdominal   Peds  Hematology  (+) Blood dyscrasia, anemia   Anesthesia Other Findings   Reproductive/Obstetrics                             Anesthesia Physical Anesthesia Plan  ASA: 2  Anesthesia Plan: General and Spinal   Post-op Pain Management:    Induction: Intravenous  PONV Risk Score and Plan: 2 and Propofol infusion, TIVA, Treatment may vary due to age or medical condition and Ondansetron  Airway Management Planned: Natural Airway and Nasal Cannula  Additional Equipment:   Intra-op Plan:   Post-operative Plan:   Informed Consent: I have reviewed the patients History and Physical, chart, labs and discussed the procedure including the risks, benefits and alternatives for the proposed anesthesia with the patient or authorized representative who has indicated his/her understanding and acceptance.       Plan Discussed with: CRNA  Anesthesia Plan Comments: (Waiting for repeat potassium level to proceed -- will treat prior to proceeding if still hypokalemic.  Plan for spinal and GA with  natural airway, LMA/GETA backup.  Patient consented for risks of anesthesia including but not limited to:  - adverse reactions to medications - damage to eyes, teeth, lips or other oral mucosa - nerve damage due to positioning  - sore throat or hoarseness - headache, bleeding, infection, nerve damage 2/2 spinal - damage to heart, brain, nerves, lungs, other parts of body or loss of life  Informed patient about role of CRNA in peri- and intra-operative care.  Patient voiced understanding.)        Anesthesia Quick Evaluation

## 2022-12-21 NOTE — Interval H&P Note (Signed)
Patient history and physical updated. Consent reviewed including risks, benefits, and alternatives to surgery. Patient agrees with above plan to proceed with right total knee arthroplasty  

## 2022-12-21 NOTE — H&P (Signed)
Chief Complaint  Patient presents with  Pre-op Exam  Right TKA scheduled 12/21/22 with Dr. Andres Ege Katie Liu is a 72 y.o. female who presents today for history and physical for right total knee arthroplasty with Dr. Audelia Acton on 12/21/2022. She has had greater than 1 year of right knee pain that has severely interfered with her quality of life and activities daily living. She has had some degree of pain since 1986 when she had ACL reconstruction. Pain is located on the anterior lateral aspect of the knee. She is underwent conservative treatment consisting of exercises, activity modification, bracing, cortisone injections. She is only had a few months worth relief only with her cortisone injection. Patient has a hard time performing her normal activities of daily living. She states that her knee will constantly catch, give way and cause her to fall.  Past Medical History: Past Medical History:  Diagnosis Date  Allergy past 2 years  Severe  Arthritis 2 months ago  GERD (gastroesophageal reflux disease) 2 years  Hyperlipidemia  Malignant neoplasm of ascending colon (CMS/HHS-HCC) 01/11/2021  Vitamin D deficiency 01/18/2017   Past Surgical History: Past Surgical History:  Procedure Laterality Date  CESAREAN SECTION 07/27/1980  COLONOSCOPY 01/03/2021  Tubular adenoma/Invasive morderately differentiated adenocarcinoma/Repeat 44months/TKT  EGD 01/03/2021  Normal EGD biopsy/Repeat as needed/TKT  Colon @ [facility] 02/07/2022  Hyperplastic polyp/PHx CC/Repeat 25yr/TKT  ACHILLES TENDON REPAIR Bilateral  Aug and Dec 2014  ARTHROSCOPIC REPAIR ACL Right  COLON SURGERY 02/24/2021  Cancer surgery  TUBAL LIGATION 07/27/1080   Past Family History: Family History  Problem Relation Age of Onset  High blood pressure (Hypertension) Mother  Macular degeneration Mother  Anxiety Mother  High blood pressure (Hypertension) Father  Alcohol abuse Father  Diabetes Brother  High blood pressure (Hypertension)  Brother  Hyperlipidemia (Elevated cholesterol) Brother  High blood pressure (Hypertension) Brother  Anxiety Brother   Medications: Current Outpatient Medications  Medication Sig Dispense Refill  aspirin 81 MG EC tablet Take 1 tablet (81 mg total) by mouth once daily 30 tablet 11  atorvastatin (LIPITOR) 40 MG tablet TAKE 1 TABLET BY MOUTH EVERY DAY 90 tablet 3  buPROPion (WELLBUTRIN XL) 150 MG XL tablet Take 150 mg by mouth once daily  CANNABIDIOL, CBD, EXTRACT ORAL Take by mouth once daily  chlorthalidone 25 MG tablet Take 1 tablet (25 mg total) by mouth once daily 90 tablet 3  fluticasone propionate (FLONASE) 50 mcg/actuation nasal spray Place 2 sprays into both nostrils once daily 48 g 3  omeprazole (PRILOSEC) 40 MG DR capsule TAKE 1 CAPSULE BY MOUTH EVERY DAY 90 capsule 3  potassium chloride (KLOR-CON) 10 MEQ ER tablet Take 1 tablet (10 mEq total) by mouth once daily 30 tablet 11  sertraline (ZOLOFT) 25 MG tablet Take 25 mg by mouth once as needed   No current facility-administered medications for this visit.   Allergies: No Known Allergies   Review of Systems:  A comprehensive 14 point ROS was performed, reviewed by me today, and the pertinent orthopaedic findings are documented in the HPI.  Exam: BP 118/70  Ht 160 cm (5\' 3" )  Wt 73.8 kg (162 lb 9.6 oz)  BMI 28.80 kg/m  General:  Well developed, well nourished, no apparent distress, normal affect, normal gait with no antalgic component.   HEENT: Head normocephalic, atraumatic, PERRL.   Abdomen: Soft, non tender, non distended, Bowel sounds present.  Heart: Examination of the heart reveals regular, rate, and rhythm. There is no murmur noted on ascultation. There  is a normal apical pulse.  Lungs: Lungs are clear to auscultation. There is no wheeze, rhonchi, or crackles. There is normal expansion of bilateral chest walls.   Comprehensive Knee Exam: Gait Antalgic  Alignment Valgus thrust on the right    Inspection Right Left  Skin Normal appearance with no obvious deformity. Healed midline incision from the inferior pole the patella to the tubercle with a small palpable anterior osteophyte, no ecchymosis or erythema. Normal appearance with no obvious deformity. No ecchymosis or erythema.  Soft Tissue No focal soft tissue swelling No focal soft tissue swelling  Quad Atrophy None None   Palpation  Right Left  Tenderness Lateral joint line tenderness palpation and parapatellar tenderness No peripatellar, patellar tendon, quad tendon, medial/lateral joint line pain  Crepitus + patellofemoral and tibiofemoral crepitus No patellofemoral or tibiofemoral crepitus  Effusion None None   Range of Motion Right Left  Flexion 5-115 Normal AROM  Extension Full knee extension without hyperextension Full knee extension without hyperextension   Ligamentous Exam Right Left  Lachman Normal Normal  Valgus 0 Normal Normal  Valgus 30 Normal Normal  Varus 0 Normal Normal  Varus 30 Normal Normal  Anterior Drawer Normal Normal  Posterior Drawer Normal Normal   Meniscal Exam Right Left  Hyperflexion Test Positive Negative  Hyperextension Test Positive Negative  McMurray's Positive Negative   Neurovascular Right Left  Quadriceps Strength 5/5 5/5  Hamstring Strength 5/5 5/5  Hip Abductor Strength 4/5 4/5  Distal Motor Normal Normal  Distal Sensory Normal light touch sensation Normal light touch sensation  Distal Pulses Normal Normal    Imaging Studies: I reviewed AP lateral and sunrise x-rays, weightbearing of the right knee performed on 10/09/2022 images reviewed by myself which show lateral joint space narrowing and severe patellofemoral joint space narrowing with bone-on-bone articulation osteophyte formation sclerosis, and subchondral cyst formation Kellgren-Lawrence grade 3/4. There is evidence of a metallic screw in the distal femoral metaphysis vertically oriented above the notch and a  metallic staple over the proximal tibia consistent with prior ACL reconstruction with evidence of ACL tunnels. No fractures or dislocations noted.  PA flexed weightbearing x-ray of the right knee reviewed today show lateral joint space narrowing with osteophyte formation and sclerosis.  Impression: Post-traumatic osteoarthritis of right knee [M17.31] Post-traumatic osteoarthritis of right knee (primary encounter diagnosis)  Plan:  24. 72 year old female with advanced right knee osteoarthritis, posttraumatic from prior injury with ACL reconstruction. She has had years of progressing right knee pain that is interfered with her quality of life and activities daily living. Risks, benefits, complications of a right total knee arthroplasty have discussed with the patient. Patient has agreed and consented procedure with Dr. Audelia Acton on 12/21/2022.  The hospitalization and post-operative care and rehabilitation were also discussed. The use of perioperative antibiotics and DVT prophylaxis were discussed. The risk, benefits and alternatives to a surgical intervention were discussed at length with the patient. The patient was also advised of risks related to the medical comorbidities and elevated body mass index (BMI). A lengthy discussion took place to review the most common complications including but not limited to: stiffness, loss of function, complex regional pain syndrome, deep vein thrombosis, pulmonary embolus, heart attack, stroke, infection, wound breakdown, numbness, intraoperative fracture, damage to nerves, tendon,muscles, arteries or other blood vessels, death and other possible complications from anesthesia. The patient was told that we will take steps to minimize these risks by using sterile technique, antibiotics and DVT prophylaxis when appropriate and follow the patient  postoperatively in the office setting to monitor progress. The possibility of recurrent pain, no improvement in pain and actual  worsening of pain were also discussed with the patient.   This note was generated in part with voice recognition software and I apologize for any typographical errors that were not detected and corrected.

## 2022-12-22 ENCOUNTER — Encounter: Payer: Self-pay | Admitting: Orthopedic Surgery

## 2022-12-22 DIAGNOSIS — Z7982 Long term (current) use of aspirin: Secondary | ICD-10-CM | POA: Diagnosis not present

## 2022-12-22 DIAGNOSIS — M1731 Unilateral post-traumatic osteoarthritis, right knee: Secondary | ICD-10-CM | POA: Diagnosis not present

## 2022-12-22 DIAGNOSIS — I10 Essential (primary) hypertension: Secondary | ICD-10-CM | POA: Diagnosis not present

## 2022-12-22 DIAGNOSIS — Z7952 Long term (current) use of systemic steroids: Secondary | ICD-10-CM | POA: Diagnosis not present

## 2022-12-22 DIAGNOSIS — Z79899 Other long term (current) drug therapy: Secondary | ICD-10-CM | POA: Diagnosis not present

## 2022-12-22 DIAGNOSIS — Z85038 Personal history of other malignant neoplasm of large intestine: Secondary | ICD-10-CM | POA: Diagnosis not present

## 2022-12-22 LAB — BASIC METABOLIC PANEL
Anion gap: 13 (ref 5–15)
BUN: 20 mg/dL (ref 8–23)
CO2: 22 mmol/L (ref 22–32)
Calcium: 8.6 mg/dL — ABNORMAL LOW (ref 8.9–10.3)
Chloride: 104 mmol/L (ref 98–111)
Creatinine, Ser: 0.79 mg/dL (ref 0.44–1.00)
GFR, Estimated: >60 mL/min (ref >60)
Glucose, Bld: 150 mg/dL — ABNORMAL HIGH (ref 70–99)
Potassium: 3.2 mmol/L — ABNORMAL LOW (ref 3.5–5.1)
Sodium: 139 mmol/L (ref 135–145)

## 2022-12-22 LAB — CBC
HCT: 34 % — ABNORMAL LOW (ref 36.0–46.0)
Hemoglobin: 12.1 g/dL (ref 12.0–15.0)
MCH: 33.6 pg (ref 26.0–34.0)
MCHC: 35.6 g/dL (ref 30.0–36.0)
MCV: 94.4 fL (ref 80.0–100.0)
Platelets: 225 10*3/uL (ref 150–400)
RBC: 3.6 MIL/uL — ABNORMAL LOW (ref 3.87–5.11)
RDW: 13.2 % (ref 11.5–15.5)
WBC: 15.3 10*3/uL — ABNORMAL HIGH (ref 4.0–10.5)
nRBC: 0 % (ref 0.0–0.2)

## 2022-12-22 LAB — GLUCOSE, CAPILLARY
Glucose-Capillary: 103 mg/dL — ABNORMAL HIGH (ref 70–99)
Glucose-Capillary: 130 mg/dL — ABNORMAL HIGH (ref 70–99)

## 2022-12-22 MED ORDER — ENOXAPARIN SODIUM 40 MG/0.4ML IJ SOSY: 40.0000 mg | PREFILLED_SYRINGE | INTRAMUSCULAR | 0 refills | Status: DC

## 2022-12-22 MED ORDER — TRAMADOL HCL 50 MG PO TABS: 50.0000 mg | ORAL_TABLET | Freq: Four times a day (QID) | ORAL | 0 refills | Status: DC | PRN

## 2022-12-22 MED ORDER — DOCUSATE SODIUM 100 MG PO CAPS: 100.0000 mg | ORAL_CAPSULE | Freq: Two times a day (BID) | ORAL | 0 refills | Status: DC

## 2022-12-22 MED ORDER — POTASSIUM CHLORIDE CRYS ER 20 MEQ PO TBCR
20.0000 meq | EXTENDED_RELEASE_TABLET | Freq: Three times a day (TID) | ORAL | Status: DC
  Administered 2022-12-22: 20 meq via ORAL
  Filled 2022-12-22: qty 1

## 2022-12-22 MED ORDER — CELECOXIB 200 MG PO CAPS: 200.0000 mg | ORAL_CAPSULE | Freq: Two times a day (BID) | ORAL | 0 refills | Status: AC

## 2022-12-22 MED ORDER — ONDANSETRON HCL 4 MG PO TABS: 4.0000 mg | ORAL_TABLET | Freq: Four times a day (QID) | ORAL | 0 refills | Status: DC | PRN

## 2022-12-22 MED ORDER — ACETAMINOPHEN 500 MG PO TABS: 1000.0000 mg | ORAL_TABLET | Freq: Three times a day (TID) | ORAL | 0 refills | Status: DC

## 2022-12-22 NOTE — Plan of Care (Signed)
  Problem: Activity: Goal: Ability to avoid complications of mobility impairment will improve Outcome: Progressing Goal: Range of joint motion will improve Outcome: Progressing   Problem: Clinical Measurements: Goal: Postoperative complications will be avoided or minimized Outcome: Progressing   Problem: Pain Management: Goal: Pain level will decrease with appropriate interventions Outcome: Progressing   Problem: Skin Integrity: Goal: Will show signs of wound healing Outcome: Progressing   Problem: Clinical Measurements: Goal: Will remain free from infection Outcome: Progressing   Problem: Safety: Goal: Ability to remain free from injury will improve Outcome: Progressing

## 2022-12-22 NOTE — Discharge Instructions (Signed)

## 2022-12-22 NOTE — Progress Notes (Addendum)
   Subjective: 1 Day Post-Op Procedure(s) (LRB): TOTAL KNEE ARTHROPLASTY (Right) Patient reports pain as mild.   Patient is well, and has had no acute complaints or problems Denies any CP, SOB, ABD pain. We will continue therapy today.  Plan is to go Home after hospital stay.  Objective: Vital signs in last 24 hours: Temp:  [97.1 F (36.2 C)-98.9 F (37.2 C)] 98.4 F (36.9 C) (05/03 0728) Pulse Rate:  [76-101] 76 (05/03 0728) Resp:  [15-20] 18 (05/03 0728) BP: (97-125)/(61-92) 111/86 (05/03 0728) SpO2:  [94 %-100 %] 97 % (05/03 0728)  Intake/Output from previous day: 05/02 0701 - 05/03 0700 In: 1100 [I.V.:1000; IV Piggyback:100] Out: 53 [Urine:3; Blood:50] Intake/Output this shift: No intake/output data recorded.  Recent Labs    12/21/22 0652 12/21/22 1144 12/22/22 0342  HGB 13.9 13.6 12.1   Recent Labs    12/21/22 1144 12/22/22 0342  WBC  --  15.3*  RBC  --  3.60*  HCT 40.0 34.0*  PLT  --  225   Recent Labs    12/21/22 1144 12/22/22 0342  NA 140 139  K 3.1* 3.2*  CL 102 104  CO2  --  22  BUN 14 20  CREATININE 0.60 0.79  GLUCOSE 98 150*  CALCIUM  --  8.6*   No results for input(s): "LABPT", "INR" in the last 72 hours.  EXAM General - Patient is Alert, Appropriate, and Oriented Extremity - Neurovascular intact Sensation intact distally Intact pulses distally Dorsiflexion/Plantar flexion intact No cellulitis present Compartment soft Dressing - dressing C/D/I and no drainage Motor Function - intact, moving foot and toes well on exam.   Past Medical History:  Diagnosis Date   Allergy    Anemia    Anxiety    Arthritis    Cancer (HCC)    colon   Complication of anesthesia    started to feel when having her cataract surgery   GERD (gastroesophageal reflux disease)    Hyperlipemia    Hypertension    Pre-diabetes    Vitamin D deficiency     Assessment/Plan:   1 Day Post-Op Procedure(s) (LRB): TOTAL KNEE ARTHROPLASTY (Right) Principal  Problem:   S/P TKR (total knee replacement) using cement, right  Estimated body mass index is 29.44 kg/m as calculated from the following:   Height as of this encounter: 5\' 2"  (1.575 m).   Weight as of this encounter: 73 kg. Advance diet Up with therapy Pain well-controlled Labs and vital signs are stable. Mild hypokalemia, potassium 3.2.  Will increase oral potassium supplementation today and have patient continue with normal potassium supplement at home. Care management to assist with discharge to home with home health PT today  DVT Prophylaxis - Lovenox, TED hose, and SCDs Weight-Bearing as tolerated to right leg   T. Cranston Neighbor, PA-C [facility] 12/22/2022, 7:57 AM   Patient seen and examined, agree with above plan.  The patient is doing well status post right total knee arthroplasty, no concerns at this time.  Pain is controlled.  Discussed DVT prophylaxis, pain medication use, and safe transition to home.  All questions answered the patient agrees with above plan will go home after clears PT.   Reinaldo Berber MD

## 2022-12-22 NOTE — Evaluation (Signed)
Physical Therapy Evaluation Patient Details Name: Katie Liu MRN: [id number] DOB: 12-31-1950 Today's Date: 12/22/2022  History of Present Illness  Pt is a 72 y.o. female s/p R TKA on 12/21/22.  Clinical Impression  Pt admitted with above diagnosis. Pt currently with functional limitations due to the deficits listed below (see PT Problem List). Pt received upright in bed agreeable to PT. PT very pleasant and motivated. Reporting she has had a couple falls prior to R TKA that has exacerbated her pain limiting mobility but she was still independent with gait, ADL's/IADL's.   To date, reviewed WB precautions and R knee positioning ot prevent contractures. Pt exits R side of bed mod-I, and supervision for STS and gait ~300' with RW completing consistent step through gait and heel strike on RLE. Gait speed recorded at a level of limited community ambulator. PT reviewed forwards and side stepping stair technique completing both safely. Pt returns to recliner reviewing car transfer, polar care, and performing HEP as listed below. R knee AROM approximately 0-90 degrees. Pt with no acute PT needs and safe to d/c home with continued PT in next venue. Orders placed for DME. Pt upright in recliner with knee in extension and polar care donned.     Recommendations for follow up therapy are one component of a multi-disciplinary discharge planning process, led by the attending physician.  Recommendations may be updated based on patient status, additional functional criteria and insurance authorization.  Assistance Recommended at Discharge Intermittent Supervision/Assistance  Patient can return home with the following  A little help with walking and/or transfers;A little help with bathing/dressing/bathroom;Assist for transportation;Help with stairs or ramp for entrance;Assistance with cooking/housework    Equipment Recommendations Rolling walker (2 wheels);BSC/3in1  Recommendations for Other Services        Functional Status Assessment Patient has had a recent decline in their functional status and demonstrates the ability to make significant improvements in function in a reasonable and predictable amount of time.     Precautions / Restrictions Precautions Precautions: Knee Precaution Booklet Issued: Yes (comment) Restrictions Weight Bearing Restrictions: Yes RLE Weight Bearing: Weight bearing as tolerated      Mobility  Bed Mobility Overal bed mobility: Modified Independent               Patient Response: Cooperative  Transfers Overall transfer level: Needs assistance Equipment used: Rolling walker (2 wheels) Transfers: Sit to/from Stand Sit to Stand: Supervision           General transfer comment: VC's for hand placement    Ambulation/Gait Ambulation/Gait assistance: Supervision Gait Distance (Feet): 300 Feet Assistive device: Rolling walker (2 wheels) Gait Pattern/deviations: Step-through pattern, Decreased step length - right, Decreased stance time - right, Decreased step length - left, Decreased stance time - left Gait velocity: 10'/5 sec = 2'/sec Gait velocity interpretation: 1.31 - 2.62 ft/sec, indicative of limited community ambulator   General Gait Details: Consistent step through gait with R heel strike. Correct use of RW  Stairs Stairs: Yes Stairs assistance: Supervision Stair Management: One rail Right, Alternating pattern, Step to pattern, Sideways, Forwards Number of Stairs: 8 General stair comments: asc/desc with forwards and side ways technique. Safe completion with each technique. Alteranting pattern with forwards asc steps.  Wheelchair Mobility    Modified Rankin (Stroke Patients Only)       Balance Overall balance assessment: Mild deficits observed, not formally tested   Sitting balance-Leahy Scale: Normal         Standing balance comment: use  of RW                             Pertinent Vitals/Pain Pain  Assessment Pain Assessment: Faces Faces Pain Scale: Hurts a little bit Pain Location: R knee Pain Descriptors / Indicators: Aching, Discomfort, Sore Pain Intervention(s): Limited activity within patient's tolerance, Monitored during session, Repositioned, Ice applied    Home Living Family/patient expects to be discharged to:: Private residence Living Arrangements: Spouse/significant other Available Help at Discharge: Family;Available 24 hours/day Type of Home: House Home Access: Stairs to enter Entrance Stairs-Rails: Right Entrance Stairs-Number of Steps: 5   Home Layout: One level Home Equipment: Rollator (4 wheels);Tub bench      Prior Function Prior Level of Function : Independent/Modified Independent;History of Falls (last six months)             Mobility Comments: no AD, limited in ambulation due to R knee pain due to x2 falls ADLs Comments: independent     Hand Dominance        Extremity/Trunk Assessment   Upper Extremity Assessment Upper Extremity Assessment: Overall WFL for tasks assessed    Lower Extremity Assessment Lower Extremity Assessment: Generalized weakness;RLE deficits/detail RLE Deficits / Details: R TKA RLE Sensation: WNL       Communication   Communication: No difficulties  Cognition Arousal/Alertness: Awake/alert Behavior During Therapy: WFL for tasks assessed/performed Overall Cognitive Status: Within Functional Limits for tasks assessed                                 General Comments: Very pleasant and motivated        General Comments      Exercises Total Joint Exercises Ankle Circles/Pumps: AROM, Strengthening, Both, 10 reps, Seated Quad Sets: AROM, Strengthening, Right, 10 reps, Supine Short Arc Quad: AROM, Strengthening, Right, 10 reps, Supine Heel Slides: AROM, Strengthening, Right, Supine, 10 reps Hip ABduction/ADduction: AROM, Strengthening, Right, Supine, 10 reps Straight Leg Raises: AROM,  Strengthening, Right, Supine, 10 reps Long Arc Quad: AROM, Strengthening, Seated, 10 reps Marching in Standing: AROM, Strengthening, Both, 5 reps Other Exercises Other Exercises: Role of PT in acute setting, WB precautions, stair training, HEP (reps/sets/frequency), car transfer, polar care   Assessment/Plan    PT Assessment All further PT needs can be met in the next venue of care  PT Problem List Decreased strength;Decreased mobility;Decreased range of motion;Pain       PT Treatment Interventions      PT Goals (Current goals can be found in the Care Plan section)  Acute Rehab PT Goals Patient Stated Goal: To go home PT Goal Formulation: With patient Time For Goal Achievement: 01/05/23 Potential to Achieve Goals: Good    Frequency       Co-evaluation               AM-PAC PT "6 Clicks" Mobility  Outcome Measure Help needed turning from your back to your side while in a flat bed without using bedrails?: None Help needed moving from lying on your back to sitting on the side of a flat bed without using bedrails?: None Help needed moving to and from a bed to a chair (including a wheelchair)?: A Little Help needed standing up from a chair using your arms (e.g., wheelchair or bedside chair)?: A Little Help needed to walk in hospital room?: A Little Help needed climbing 3-5 steps with a  railing? : A Little 6 Click Score: 20    End of Session Equipment Utilized During Treatment: Gait belt Activity Tolerance: Patient tolerated treatment well Patient left: in chair;with call bell/phone within reach;with SCD's reapplied Nurse Communication: Mobility status PT Visit Diagnosis: Difficulty in walking, not elsewhere classified (R26.2);Pain;Muscle weakness (generalized) (M62.81) Pain - Right/Left: Right Pain - part of body: Knee    Time: 1610-9604 PT Time Calculation (min) (ACUTE ONLY): 38 min   Charges:   PT Evaluation $PT Eval Low Complexity: 1 Low PT Treatments $Gait  Training: 8-22 mins $Therapeutic Exercise: 8-22 mins       Chemeka Filice M. Fairly IV, PT, DPT Physical Therapist- [place]  [facility]  12/22/2022, 10:21 AM

## 2022-12-22 NOTE — TOC Transition Note (Signed)
Transition of Care [facility]) - CM/SW Discharge Note   Patient Details  Name: Katie Liu MRN: [id number] Date of Birth: 07-Oct-1950  [facility] CM/SW Contact:  Garret Reddish, RN Phone Number: 12/22/2022, 11:07 AM   Clinical Narrative:  Chart reviewed.  Patient will be a discharge for today.  [facility] will provide [facility] PT/OT on discharge.  I have made Kandee Keen with [facility] aware that patient will be a discharge for today.    PT has recommended that patient need a 2 wheeled rolling walker and Bedside Commode. Patient has declined the [facility].  I have asked Barbara Cower with [employer] to provide the 2 wheeled rolling walker. [employer] reports that they have delivered the 2 wheeled rolling walker at bedside.    I have made staff nurse aware.        Final next level of care: Home w Home Health Services Barriers to Discharge: No Barriers Identified   Patient Goals and CMS Choice   Choice offered to / list presented to : Patient  Discharge Placement                      Patient and family notified of of transfer: 12/22/22  Discharge Plan and Services Additional resources added to the After Visit Summary for                  DME Arranged: Walker rolling (Patient declined [facility]) DME Agency: [employer] Date DME Agency Contacted: 12/22/22 Time DME Agency Contacted: 1000 Representative spoke with at DME Agency: Barbara Cower HH Arranged: PT, OT (Arranged prior to surgery per surgeons office patient agreeable to using [facility]) HH Agency: [facility] Care Date [facility] Agency Contacted:  (Arranged prior to admission. Kandee Keen with [facility] informed that patient will be a discharge for today.) Time HH Agency Contacted: [phone] Representative spoke with at [facility] Agency: Kandee Keen  Social Determinants of Health (SDOH) Interventions SDOH Screenings   Food Insecurity: No Food Insecurity (12/21/2022)  Housing: Low Risk  (12/21/2022)  Transportation Needs: No Transportation Needs (12/21/2022)  Utilities: Not  At Risk (12/21/2022)  Tobacco Use: High Risk (12/21/2022)     Readmission Risk Interventions     No data to display

## 2022-12-22 NOTE — Progress Notes (Signed)
Patient is not able to walk the distance required to go the bathroom, or she is unable to safely negotiate stairs required to access the bathroom.  A 3in1 BSC will alleviate this problem.       T. Chris Filipe Greathouse, PA-C [facility] 

## 2022-12-22 NOTE — Discharge Summary (Signed)
Physician Discharge Summary  Patient ID: Katie Liu MRN: [id number] DOB/AGE: 03-23-1951 72 y.o.  Admit date: 12/21/2022 Discharge date: 12/22/2022  Admission Diagnoses:  S/P TKR (total knee replacement) using cement, right [Z96.651]   Discharge Diagnoses: Patient Active Problem List   Diagnosis Date Noted   S/P TKR (total knee replacement) using cement, right 12/21/2022   Hyperlipidemia 03/02/2021   Colon cancer (HCC) 02/24/2021   Malignant neoplasm of ascending colon (HCC) 01/11/2021   Goals of care, counseling/discussion 01/11/2021   Leukocytosis 01/11/2021   Aortic atherosclerosis (HCC) 06/21/2018   Chronic right shoulder pain 04/04/2017   Tobacco abuse 01/18/2017   Vitamin D deficiency 01/18/2017    Past Medical History:  Diagnosis Date   Allergy    Anemia    Anxiety    Arthritis    Cancer (HCC)    colon   Complication of anesthesia    started to feel when having her cataract surgery   GERD (gastroesophageal reflux disease)    Hyperlipemia    Hypertension    Pre-diabetes    Vitamin D deficiency      Transfusion: None   Consultants (if any):   Discharged Condition: Improved  Hospital Course: Katie Liu is an 72 y.o. female who was admitted 12/21/2022 with a diagnosis of S/P TKR (total knee replacement) using cement, right and went to the operating room on 12/21/2022 and underwent the above named procedures.    Surgeries: Procedure(s): TOTAL KNEE ARTHROPLASTY on 12/21/2022 Patient tolerated the surgery well. Taken to PACU where she was stabilized and then transferred to the orthopedic floor.  Started on Lovenox 30 mg q 12 hrs. TEDs and SCDs applied bilaterally. Heels elevated on bed. No evidence of DVT. Negative Homan. Physical therapy started on day #1 for gait training and transfer. OT started day #1 for ADL and assisted devices.  Patient's IV leak was d/c on day #1. Patient was able to safely and independently complete all PT goals. PT recommending  discharge to home.    On post op day #1 patient was stable and ready for discharge to home with home health PT.  Implants: Femur: Persona #5 Std CR   Tibia: Persona Size C w/ 14x74mm stem extension  Poly: 10mm MC  Patella: 32mm x8.73mm symmetric button   She was given perioperative antibiotics:  Anti-infectives (From admission, onward)    Start     Dose/Rate Route Frequency Ordered Stop   12/21/22 1830  ceFAZolin (ANCEF) IVPB 2g/100 mL premix        2 g 200 mL/hr over 30 Minutes Intravenous Every 6 hours 12/21/22 1537 12/22/22 0325   12/21/22 0615  ceFAZolin (ANCEF) IVPB 2g/100 mL premix        2 g 200 mL/hr over 30 Minutes Intravenous On call to O.R. 12/21/22 1478 12/21/22 1229     .  She was given sequential compression devices, early ambulation, and Lovenox, teds for DVT prophylaxis.  She benefited maximally from the hospital stay and there were no complications.    Recent vital signs:  Vitals:   12/22/22 0341 12/22/22 0728  BP: 112/68 111/86  Pulse: 86 76  Resp: 20 18  Temp: 97.6 F (36.4 C) 98.4 F (36.9 C)  SpO2: 96% 97%    Recent laboratory studies:  Lab Results  Component Value Date   HGB 12.1 12/22/2022   HGB 13.6 12/21/2022   HGB 13.9 12/21/2022   Lab Results  Component Value Date   WBC 15.3 (H) 12/22/2022  PLT 225 12/22/2022   No results found for: "INR" Lab Results  Component Value Date   NA 139 12/22/2022   K 3.2 (L) 12/22/2022   CL 104 12/22/2022   CO2 22 12/22/2022   BUN 20 12/22/2022   CREATININE 0.79 12/22/2022   GLUCOSE 150 (H) 12/22/2022    Discharge Medications:   Allergies as of 12/22/2022   No Known Allergies      Medication List     STOP taking these medications    ibuprofen 200 MG tablet Commonly known as: ADVIL       TAKE these medications    acetaminophen 500 MG tablet Commonly known as: TYLENOL Take 2 tablets (1,000 mg total) by mouth every 8 (eight) hours.   aspirin 81 MG chewable tablet Chew 81 mg by mouth  daily.   atorvastatin 40 MG tablet Commonly known as: LIPITOR Take 40 mg by mouth daily.   celecoxib 200 MG capsule Commonly known as: CeleBREX Take 1 capsule (200 mg total) by mouth 2 (two) times daily for 14 days.   cetirizine 10 MG tablet Commonly known as: ZYRTEC Take 10 mg by mouth daily as needed for allergies.   chlorthalidone 25 MG tablet Commonly known as: HYGROTON Take 25 mg by mouth daily.   cyanocobalamin 1000 MCG tablet Commonly known as: VITAMIN B12 Take 500 mcg by mouth daily.   docusate sodium 100 MG capsule Commonly known as: COLACE Take 1 capsule (100 mg total) by mouth 2 (two) times daily.   enoxaparin 40 MG/0.4ML injection Commonly known as: LOVENOX Inject 0.4 mLs (40 mg total) into the skin daily for 14 days.   fluticasone 50 MCG/ACT nasal spray Commonly known as: FLONASE Place 2 sprays into both nostrils daily.   multivitamin capsule Take 1 capsule by mouth daily.   Omega-3 1000 MG Caps Take 1,000 mg by mouth daily.   omeprazole 40 MG capsule Commonly known as: PRILOSEC Take 40 mg by mouth daily.   ondansetron 4 MG tablet Commonly known as: ZOFRAN Take 1 tablet (4 mg total) by mouth every 6 (six) hours as needed for nausea.   potassium chloride SA 20 MEQ tablet Commonly known as: KLOR-CON M Take 3 tablets (60 mEq) today, the 1 tablet (20 mEq) daily until surgery. Be sure to take dose on day of surgery. Follow up with PCP for repeat labs.   sertraline 25 MG tablet Commonly known as: ZOLOFT Take 25 mg by mouth as needed.   SYSTANE BALANCE OP Place 1-2 drops into both eyes 2 (two) times daily as needed (for dryness or irritation).   traMADol 50 MG tablet Commonly known as: ULTRAM Take 1 tablet (50 mg total) by mouth every 6 (six) hours as needed for moderate pain.   Vitamin D3 25 MCG (1000 UT) Caps Take 2,000 Units by mouth at bedtime.        Diagnostic Studies: DG Knee Right Port  Result Date: 12/21/2022 CLINICAL DATA:  [id number]  Surgery, elective [id number] EXAM: PORTABLE RIGHT KNEE - 1-2 VIEW COMPARISON:  April 06, 2022. FINDINGS: Status post knee arthroplasty. Orthopedic hardware is intact and without periprosthetic fracture or lucency. Retained screw of the distal femur status post prior ACL repair. Expected soft tissue air. IMPRESSION: Expected postsurgical changes status post right knee arthroplasty. Electronically Signed   By: Meda Klinefelter M.D.   On: 12/21/2022 15:15    Disposition:      Follow-up Information     Evon Slack, PA-C Follow up in 2  week(s).   Specialties: Orthopedic Surgery, Emergency Medicine Contact information: [address] [place] [address] [phone]                  Signed: Patience Musca 12/22/2022, 8:10 AM

## 2022-12-22 NOTE — Anesthesia Postprocedure Evaluation (Signed)
Anesthesia Post Note  Patient: Katie Liu  Procedure(s) Performed: TOTAL KNEE ARTHROPLASTY (Right: Knee)  Patient location during evaluation: PACU Anesthesia Type: Spinal Level of consciousness: awake and alert, oriented and patient cooperative Pain management: pain level controlled Vital Signs Assessment: post-procedure vital signs reviewed and stable Respiratory status: spontaneous breathing, nonlabored ventilation and respiratory function stable Cardiovascular status: blood pressure returned to baseline and stable Postop Assessment: adequate PO intake, no headache, no backache and spinal receding Anesthetic complications: no   No notable events documented.   Last Vitals:  Vitals:   12/21/22 2329 12/22/22 0341  BP: 109/61 112/68  Pulse: 97 86  Resp: 20 20  Temp: 37.2 C 36.4 C  SpO2: 96% 96%    Last Pain:  Vitals:   12/22/22 0552  PainSc: 4                  Reed Breech

## 2022-12-23 DIAGNOSIS — I10 Essential (primary) hypertension: Secondary | ICD-10-CM | POA: Diagnosis not present

## 2022-12-23 DIAGNOSIS — D649 Anemia, unspecified: Secondary | ICD-10-CM | POA: Diagnosis not present

## 2022-12-23 DIAGNOSIS — E785 Hyperlipidemia, unspecified: Secondary | ICD-10-CM | POA: Diagnosis not present

## 2022-12-23 DIAGNOSIS — M1990 Unspecified osteoarthritis, unspecified site: Secondary | ICD-10-CM | POA: Diagnosis not present

## 2022-12-23 DIAGNOSIS — G8929 Other chronic pain: Secondary | ICD-10-CM | POA: Diagnosis not present

## 2022-12-23 DIAGNOSIS — I700 Atherosclerosis of aorta: Secondary | ICD-10-CM | POA: Diagnosis not present

## 2022-12-23 DIAGNOSIS — M25511 Pain in right shoulder: Secondary | ICD-10-CM | POA: Diagnosis not present

## 2022-12-23 DIAGNOSIS — Z471 Aftercare following joint replacement surgery: Secondary | ICD-10-CM | POA: Diagnosis not present

## 2022-12-23 DIAGNOSIS — R7303 Prediabetes: Secondary | ICD-10-CM | POA: Diagnosis not present

## 2022-12-26 DIAGNOSIS — D649 Anemia, unspecified: Secondary | ICD-10-CM | POA: Diagnosis not present

## 2022-12-26 DIAGNOSIS — I700 Atherosclerosis of aorta: Secondary | ICD-10-CM | POA: Diagnosis not present

## 2022-12-26 DIAGNOSIS — G8929 Other chronic pain: Secondary | ICD-10-CM | POA: Diagnosis not present

## 2022-12-26 DIAGNOSIS — M25511 Pain in right shoulder: Secondary | ICD-10-CM | POA: Diagnosis not present

## 2022-12-26 DIAGNOSIS — I10 Essential (primary) hypertension: Secondary | ICD-10-CM | POA: Diagnosis not present

## 2022-12-26 DIAGNOSIS — Z471 Aftercare following joint replacement surgery: Secondary | ICD-10-CM | POA: Diagnosis not present

## 2022-12-26 DIAGNOSIS — E785 Hyperlipidemia, unspecified: Secondary | ICD-10-CM | POA: Diagnosis not present

## 2022-12-26 DIAGNOSIS — M1990 Unspecified osteoarthritis, unspecified site: Secondary | ICD-10-CM | POA: Diagnosis not present

## 2022-12-26 DIAGNOSIS — R7303 Prediabetes: Secondary | ICD-10-CM | POA: Diagnosis not present

## 2022-12-29 DIAGNOSIS — I700 Atherosclerosis of aorta: Secondary | ICD-10-CM | POA: Diagnosis not present

## 2022-12-29 DIAGNOSIS — I10 Essential (primary) hypertension: Secondary | ICD-10-CM | POA: Diagnosis not present

## 2022-12-29 DIAGNOSIS — M1990 Unspecified osteoarthritis, unspecified site: Secondary | ICD-10-CM | POA: Diagnosis not present

## 2022-12-29 DIAGNOSIS — E785 Hyperlipidemia, unspecified: Secondary | ICD-10-CM | POA: Diagnosis not present

## 2022-12-29 DIAGNOSIS — R7303 Prediabetes: Secondary | ICD-10-CM | POA: Diagnosis not present

## 2022-12-29 DIAGNOSIS — G8929 Other chronic pain: Secondary | ICD-10-CM | POA: Diagnosis not present

## 2022-12-29 DIAGNOSIS — Z471 Aftercare following joint replacement surgery: Secondary | ICD-10-CM | POA: Diagnosis not present

## 2022-12-29 DIAGNOSIS — D649 Anemia, unspecified: Secondary | ICD-10-CM | POA: Diagnosis not present

## 2022-12-29 DIAGNOSIS — M25511 Pain in right shoulder: Secondary | ICD-10-CM | POA: Diagnosis not present

## 2023-01-02 DIAGNOSIS — G8929 Other chronic pain: Secondary | ICD-10-CM | POA: Diagnosis not present

## 2023-01-02 DIAGNOSIS — I700 Atherosclerosis of aorta: Secondary | ICD-10-CM | POA: Diagnosis not present

## 2023-01-02 DIAGNOSIS — I10 Essential (primary) hypertension: Secondary | ICD-10-CM | POA: Diagnosis not present

## 2023-01-02 DIAGNOSIS — M25511 Pain in right shoulder: Secondary | ICD-10-CM | POA: Diagnosis not present

## 2023-01-02 DIAGNOSIS — R7303 Prediabetes: Secondary | ICD-10-CM | POA: Diagnosis not present

## 2023-01-02 DIAGNOSIS — Z471 Aftercare following joint replacement surgery: Secondary | ICD-10-CM | POA: Diagnosis not present

## 2023-01-02 DIAGNOSIS — M1990 Unspecified osteoarthritis, unspecified site: Secondary | ICD-10-CM | POA: Diagnosis not present

## 2023-01-02 DIAGNOSIS — D649 Anemia, unspecified: Secondary | ICD-10-CM | POA: Diagnosis not present

## 2023-01-02 DIAGNOSIS — E785 Hyperlipidemia, unspecified: Secondary | ICD-10-CM | POA: Diagnosis not present

## 2023-01-04 DIAGNOSIS — R7303 Prediabetes: Secondary | ICD-10-CM | POA: Diagnosis not present

## 2023-01-04 DIAGNOSIS — I700 Atherosclerosis of aorta: Secondary | ICD-10-CM | POA: Diagnosis not present

## 2023-01-04 DIAGNOSIS — G8929 Other chronic pain: Secondary | ICD-10-CM | POA: Diagnosis not present

## 2023-01-04 DIAGNOSIS — M25511 Pain in right shoulder: Secondary | ICD-10-CM | POA: Diagnosis not present

## 2023-01-04 DIAGNOSIS — D649 Anemia, unspecified: Secondary | ICD-10-CM | POA: Diagnosis not present

## 2023-01-04 DIAGNOSIS — E785 Hyperlipidemia, unspecified: Secondary | ICD-10-CM | POA: Diagnosis not present

## 2023-01-04 DIAGNOSIS — I10 Essential (primary) hypertension: Secondary | ICD-10-CM | POA: Diagnosis not present

## 2023-01-04 DIAGNOSIS — M1990 Unspecified osteoarthritis, unspecified site: Secondary | ICD-10-CM | POA: Diagnosis not present

## 2023-01-04 DIAGNOSIS — Z471 Aftercare following joint replacement surgery: Secondary | ICD-10-CM | POA: Diagnosis not present

## 2023-01-08 ENCOUNTER — Encounter: Payer: Self-pay | Admitting: Oncology

## 2023-01-08 ENCOUNTER — Ambulatory Visit
Admission: RE | Admit: 2023-01-08 | Discharge: 2023-01-08 | Disposition: A | Discharge status: Home / Self Care | Payer: Medicare HMO | Source: Ambulatory Visit | Attending: Oncology | Admitting: Oncology

## 2023-01-08 DIAGNOSIS — C189 Malignant neoplasm of colon, unspecified: Secondary | ICD-10-CM | POA: Diagnosis not present

## 2023-01-08 DIAGNOSIS — C182 Malignant neoplasm of ascending colon: Secondary | ICD-10-CM

## 2023-01-08 MED ORDER — BARIUM SULFATE 2 % PO SUSP
450.0000 mL | Freq: Once | ORAL | Status: AC
  Administered 2023-01-08: 900 mL via ORAL

## 2023-01-08 MED ORDER — IOHEXOL 300 MG/ML  SOLN
100.0000 mL | Freq: Once | INTRAMUSCULAR | Status: AC | PRN
  Administered 2023-01-08: 100 mL via INTRAVENOUS

## 2023-01-09 DIAGNOSIS — Z471 Aftercare following joint replacement surgery: Secondary | ICD-10-CM | POA: Diagnosis not present

## 2023-01-09 DIAGNOSIS — M1990 Unspecified osteoarthritis, unspecified site: Secondary | ICD-10-CM | POA: Diagnosis not present

## 2023-01-09 DIAGNOSIS — D649 Anemia, unspecified: Secondary | ICD-10-CM | POA: Diagnosis not present

## 2023-01-09 DIAGNOSIS — M25511 Pain in right shoulder: Secondary | ICD-10-CM | POA: Diagnosis not present

## 2023-01-09 DIAGNOSIS — E785 Hyperlipidemia, unspecified: Secondary | ICD-10-CM | POA: Diagnosis not present

## 2023-01-09 DIAGNOSIS — I10 Essential (primary) hypertension: Secondary | ICD-10-CM | POA: Diagnosis not present

## 2023-01-09 DIAGNOSIS — I700 Atherosclerosis of aorta: Secondary | ICD-10-CM | POA: Diagnosis not present

## 2023-01-09 DIAGNOSIS — R7303 Prediabetes: Secondary | ICD-10-CM | POA: Diagnosis not present

## 2023-01-09 DIAGNOSIS — G8929 Other chronic pain: Secondary | ICD-10-CM | POA: Diagnosis not present

## 2023-01-10 ENCOUNTER — Inpatient Hospital Stay: Payer: Medicare HMO | Attending: Oncology

## 2023-01-10 DIAGNOSIS — Z9049 Acquired absence of other specified parts of digestive tract: Secondary | ICD-10-CM | POA: Diagnosis not present

## 2023-01-10 DIAGNOSIS — F17210 Nicotine dependence, cigarettes, uncomplicated: Secondary | ICD-10-CM | POA: Diagnosis not present

## 2023-01-10 DIAGNOSIS — E876 Hypokalemia: Secondary | ICD-10-CM | POA: Insufficient documentation

## 2023-01-10 DIAGNOSIS — C182 Malignant neoplasm of ascending colon: Secondary | ICD-10-CM | POA: Insufficient documentation

## 2023-01-10 DIAGNOSIS — Z79899 Other long term (current) drug therapy: Secondary | ICD-10-CM | POA: Insufficient documentation

## 2023-01-10 DIAGNOSIS — Z7982 Long term (current) use of aspirin: Secondary | ICD-10-CM | POA: Insufficient documentation

## 2023-01-10 DIAGNOSIS — Z96651 Presence of right artificial knee joint: Secondary | ICD-10-CM | POA: Diagnosis not present

## 2023-01-10 LAB — CBC WITH DIFFERENTIAL/PLATELET
Abs Immature Granulocytes: 0.03 10*3/uL (ref 0.00–0.07)
Basophils Absolute: 0.1 10*3/uL (ref 0.0–0.1)
Basophils Relative: 1 %
Eosinophils Absolute: 0.5 10*3/uL (ref 0.0–0.5)
Eosinophils Relative: 5 %
HCT: 38.5 % (ref 36.0–46.0)
Hemoglobin: 13.6 g/dL (ref 12.0–15.0)
Immature Granulocytes: 0 %
Lymphocytes Relative: 35 %
Lymphs Abs: 3.1 10*3/uL (ref 0.7–4.0)
MCH: 33.4 pg (ref 26.0–34.0)
MCHC: 35.3 g/dL (ref 30.0–36.0)
MCV: 94.6 fL (ref 80.0–100.0)
Monocytes Absolute: 0.6 10*3/uL (ref 0.1–1.0)
Monocytes Relative: 7 %
Neutro Abs: 4.6 10*3/uL (ref 1.7–7.7)
Neutrophils Relative %: 52 %
Platelets: 360 10*3/uL (ref 150–400)
RBC: 4.07 MIL/uL (ref 3.87–5.11)
RDW: 14.3 % (ref 11.5–15.5)
WBC: 8.8 10*3/uL (ref 4.0–10.5)
nRBC: 0 % (ref 0.0–0.2)

## 2023-01-10 LAB — COMPREHENSIVE METABOLIC PANEL
ALT: 17 U/L (ref 0–44)
AST: 23 U/L (ref 15–41)
Albumin: 4.3 g/dL (ref 3.5–5.0)
Alkaline Phosphatase: 80 U/L (ref 38–126)
Anion gap: 15 (ref 5–15)
BUN: 16 mg/dL (ref 8–23)
CO2: 27 mmol/L (ref 22–32)
Calcium: 9.4 mg/dL (ref 8.9–10.3)
Chloride: 96 mmol/L — ABNORMAL LOW (ref 98–111)
Creatinine, Ser: 0.81 mg/dL (ref 0.44–1.00)
GFR, Estimated: >60 mL/min (ref >60)
Glucose, Bld: 97 mg/dL (ref 70–99)
Potassium: 2.8 mmol/L — ABNORMAL LOW (ref 3.5–5.1)
Sodium: 138 mmol/L (ref 135–145)
Total Bilirubin: 0.9 mg/dL (ref 0.3–1.2)
Total Protein: 7.7 g/dL (ref 6.5–8.1)

## 2023-01-11 ENCOUNTER — Telehealth: Payer: Self-pay

## 2023-01-11 DIAGNOSIS — G8929 Other chronic pain: Secondary | ICD-10-CM | POA: Diagnosis not present

## 2023-01-11 DIAGNOSIS — I10 Essential (primary) hypertension: Secondary | ICD-10-CM | POA: Diagnosis not present

## 2023-01-11 DIAGNOSIS — D649 Anemia, unspecified: Secondary | ICD-10-CM | POA: Diagnosis not present

## 2023-01-11 DIAGNOSIS — M25511 Pain in right shoulder: Secondary | ICD-10-CM | POA: Diagnosis not present

## 2023-01-11 DIAGNOSIS — E785 Hyperlipidemia, unspecified: Secondary | ICD-10-CM | POA: Diagnosis not present

## 2023-01-11 DIAGNOSIS — M1990 Unspecified osteoarthritis, unspecified site: Secondary | ICD-10-CM | POA: Diagnosis not present

## 2023-01-11 DIAGNOSIS — R7303 Prediabetes: Secondary | ICD-10-CM | POA: Diagnosis not present

## 2023-01-11 DIAGNOSIS — I700 Atherosclerosis of aorta: Secondary | ICD-10-CM | POA: Diagnosis not present

## 2023-01-11 DIAGNOSIS — Z471 Aftercare following joint replacement surgery: Secondary | ICD-10-CM | POA: Diagnosis not present

## 2023-01-11 LAB — CEA: CEA: 5 ng/mL — ABNORMAL HIGH (ref 0.0–4.7)

## 2023-01-11 NOTE — Telephone Encounter (Signed)
-----   Message from Rickard Patience, MD sent at 01/10/2023  8:05 PM EDT ----- Low potassium please move her appt up to this week with MD + IV potassium thanks.

## 2023-01-11 NOTE — Telephone Encounter (Signed)
Thanks

## 2023-01-11 NOTE — Telephone Encounter (Signed)
Called patient and informed her that her potassium is low and that Dr. Cathie Hoops would like to move her appointment up to this week with possible IV potassium.

## 2023-01-12 ENCOUNTER — Inpatient Hospital Stay: Payer: Medicare HMO

## 2023-01-12 ENCOUNTER — Encounter: Payer: Self-pay | Admitting: Oncology

## 2023-01-12 ENCOUNTER — Inpatient Hospital Stay: Payer: Medicare HMO | Admitting: Oncology

## 2023-01-12 VITALS — BP 125/78 | HR 93 | Temp 97.4°F | Resp 18 | Wt 156.6 lb

## 2023-01-12 DIAGNOSIS — Z7982 Long term (current) use of aspirin: Secondary | ICD-10-CM | POA: Diagnosis not present

## 2023-01-12 DIAGNOSIS — C182 Malignant neoplasm of ascending colon: Secondary | ICD-10-CM

## 2023-01-12 DIAGNOSIS — E876 Hypokalemia: Secondary | ICD-10-CM | POA: Diagnosis not present

## 2023-01-12 DIAGNOSIS — Z720 Tobacco use: Secondary | ICD-10-CM | POA: Diagnosis not present

## 2023-01-12 DIAGNOSIS — Z79899 Other long term (current) drug therapy: Secondary | ICD-10-CM | POA: Diagnosis not present

## 2023-01-12 DIAGNOSIS — F17210 Nicotine dependence, cigarettes, uncomplicated: Secondary | ICD-10-CM | POA: Diagnosis not present

## 2023-01-12 DIAGNOSIS — Z96651 Presence of right artificial knee joint: Secondary | ICD-10-CM | POA: Diagnosis not present

## 2023-01-12 DIAGNOSIS — Z9049 Acquired absence of other specified parts of digestive tract: Secondary | ICD-10-CM | POA: Diagnosis not present

## 2023-01-12 MED ORDER — SODIUM CHLORIDE 0.9 % IV BOLUS
250.0000 mL | INTRAVENOUS | Status: DC
  Filled 2023-01-12: qty 250

## 2023-01-12 MED ORDER — SODIUM CHLORIDE 0.9 % IV SOLN
Freq: Once | INTRAVENOUS | Status: DC
  Filled 2023-01-12: qty 250

## 2023-01-12 MED ORDER — POTASSIUM CHLORIDE 10 MEQ/100ML IV SOLN
10.0000 meq | INTRAVENOUS | Status: AC
  Administered 2023-01-12 (×2): 10 meq via INTRAVENOUS
  Filled 2023-01-12: qty 100

## 2023-01-12 MED ORDER — SODIUM CHLORIDE 0.9 % IV BOLUS
250.0000 mL | INTRAVENOUS | Status: AC
  Administered 2023-01-12: 250 mL via INTRAVENOUS
  Filled 2023-01-12: qty 250

## 2023-01-12 MED ORDER — POTASSIUM CHLORIDE 10 MEQ/100ML IV SOLN
10.0000 meq | INTRAVENOUS | Status: DC
  Filled 2023-01-12: qty 100

## 2023-01-12 MED ORDER — SODIUM CHLORIDE 0.9 % IV SOLN: 20.0000 meq | Freq: Once | INTRAVENOUS | Status: DC

## 2023-01-12 NOTE — Assessment & Plan Note (Signed)
Due to chlorthalidone, which she will stop Continue oral potassium supplementation daily Recommend IV potassium today.  She has appt with pcp's office to repeat K level

## 2023-01-12 NOTE — Progress Notes (Signed)
Hematology/Oncology Progress note [phone] [phone]       Patient Care Team: Lynnea Ferrier, MD as PCP - General (Internal Medicine) Benita Gutter, RN as Oncology Nurse Navigator  ASSESSMENT & PLAN:   Cancer Staging  Malignant neoplasm of ascending colon [facility]) Staging form: Colon and Rectum, AJCC 8th Edition - Pathologic stage from 03/02/2021: Stage IIA (pT3, pN0, cM0) - Signed by Rickard Patience, MD on 03/02/2021    Malignant neoplasm of ascending colon (HCC) #Ascending colon carcinoma with mucinous feature, pT3 pN0 cMx, Stage II disease, no high risk features,  Labs reviewed and discussed with patient. CEA is chronically elevated, close to baseline.  Possible secondary to smoking history. Patient is doing very well clinically. May 2024 CT scan shows no signs of cancer recurrence. Continue surveillance.  Repeat CT in 6 months   Tobacco use Tobacco use, I encouraged her smoke cessation efforts.  Previous elevated CEA could be secondary to smoking.  Hypokalemia Due to chlorthalidone, which she will stop Continue oral potassium supplementation daily Recommend IV potassium today.  She has appt with pcp's office to repeat K level    Orders Placed This Encounter  Procedures   CT CHEST ABDOMEN PELVIS W CONTRAST    Standing Status:   Future    Standing Expiration Date:   01/12/2024    Order Specific Question:   If indicated for the ordered procedure, I authorize the administration of contrast media per Radiology protocol    Answer:   Yes    Order Specific Question:   Does the patient have a contrast media/X-ray dye allergy?    Answer:   No    Order Specific Question:   Preferred imaging location?    Answer:   [facility]    Order Specific Question:   If indicated for the ordered procedure, I authorize the administration of oral contrast media per Radiology protocol    Answer:   Yes   CBC with Differential (Cancer Center Only)     Standing Status:   Future    Standing Expiration Date:   01/12/2024   CEA    Standing Status:   Future    Standing Expiration Date:   01/12/2024   CMP (Cancer Center only)    Standing Status:   Future    Standing Expiration Date:   01/12/2024   Follow-up in 6 months All questions were answered. The patient knows to call the clinic with any problems, questions or concerns.  Rickard Patience, MD, PhD [facility] Hematology Oncology 01/12/2023   CHIEF COMPLAINTS/REASON FOR VISIT:  right colon cancer follow up  HISTORY OF PRESENTING ILLNESS:   Katie Liu is a  72 y.o.  female presents for follow up of Stage II right colon cancer Oncology History  Malignant neoplasm of ascending colon (HCC)  12/30/2020 Imaging   CT abdomen/pelvis angiogram was obtained for evaluation of postprandial abdominal pain. There was no significant narrowing of the mesenteric arteries or veins.  Masslike thickening of the wall of the cecum spacious for malignancy   01/11/2021 Initial Diagnosis   Malignant neoplasm of ascending colon  -12/21/2020, patient was referred to see [facility] gastroenterology Dr. Fannie Knee for evaluation of generalized postprandial abdominal pain and unintentional weight loss.  Symptom onset is 2 to 3 months, some nausea without emesis.  20 pounds of unintentional weight loss within the past few months.  Early satiety, only eating one third of her plate.   -7/84/6962, EGD showed  esophageal mucosal.  1cm hiatal hernia Colonoscopy findings includes 7 mm polyp in the rectum, resected and retrieved, nonbleeding internal -pathology showed tubular adenoma negative for high-grade dysplasia and malignancy.  Hemorrhoids. Malignant partially obstructing tumor in the proximal ascending colon.  Tattooed and biopsied. -Proximal ascending colon mass biopsy pathology showed invasive moderately differentiated adenocarcinoma   02/24/2021 Surgery   patient underwent right hemicolectomy. Invasive  adenocarcinoma with mucinous features 8cm, grade 2, negative LVI or perineural invasion. carcinoma extends into pericolonic connective tissue. Margin is negative. 0/21 lymph nodes positive.  pT3 pN0, stage II      03/02/2021 Cancer Staging   Staging form: Colon and Rectum, AJCC 8th Edition - Pathologic stage from 03/02/2021: Stage IIA (pT3, pN0, cM0) - Signed by Rickard Patience, MD on 03/02/2021 Stage prefix: Initial diagnosis Total positive nodes: 0   12/05/2021 Imaging   CT chest abdomen pelvis with contrast showed no evidence of recurrence or metastatic disease in the chest, abdomen or pelvis.    07/10/2022 Imaging   CT chest abdomen pelvis with contrast 1. Prior right hemicolectomy with ileocolic anastomosis without evidence of local recurrence. 2. No evidence of metastatic disease within the chest, abdomen, or pelvis. 3. Moderate volume of formed stool throughout the colon suggestive of constipation. 4.  Aortic Atherosclerosis .      INTERVAL HISTORY Katie Liu is a 72 y.o. female who has above history reviewed by me today presents for follow up visit for management of stage II right colon cancer S/p right knee replacement recently.  Potassium has been low, she got potassium IV prior to her surgery.  Currently she take potassium daily.  Her PCP has recommended her to stop chlorthalidone and switch to spirolactone, she has not switched yet.   She has good appetite, Denies blood in the stool, abdominal pain. .     Review of Systems  Constitutional:  Negative for appetite change, chills, fatigue, fever and unexpected weight change.  HENT:   Negative for hearing loss and voice change.   Eyes:  Negative for eye problems.  Respiratory:  Negative for chest tightness and cough.   Cardiovascular:  Negative for chest pain.  Gastrointestinal:  Negative for abdominal distention, abdominal pain and blood in stool.  Endocrine: Negative for hot flashes.  Genitourinary:  Negative for  difficulty urinating and frequency.   Musculoskeletal:  Negative for arthralgias.       Right knee TKR  Skin:  Negative for itching and rash.  Neurological:  Negative for extremity weakness.  Hematological:  Negative for adenopathy.  Psychiatric/Behavioral:  Negative for confusion.     MEDICAL HISTORY:  Past Medical History:  Diagnosis Date   Allergy    Anemia    Anxiety    Arthritis    Cancer (HCC)    colon   Complication of anesthesia    started to feel when having her cataract surgery   GERD (gastroesophageal reflux disease)    Hyperlipemia    Hypertension    Pre-diabetes    Vitamin D deficiency     SURGICAL HISTORY: Past Surgical History:  Procedure Laterality Date   ACHILLES TENDON REPAIR Bilateral    CESAREAN SECTION     COLON SURGERY     COLONOSCOPY WITH PROPOFOL N/A 01/03/2021   Procedure: COLONOSCOPY WITH PROPOFOL;  Surgeon: Toledo, Boykin Nearing, MD;  Location: [facility] ENDOSCOPY;  Service: Gastroenterology;  Laterality: N/A;   ESOPHAGOGASTRODUODENOSCOPY (EGD) WITH PROPOFOL N/A 01/03/2021   Procedure: ESOPHAGOGASTRODUODENOSCOPY (EGD) WITH PROPOFOL;  Surgeon: [place],  Boykin Nearing, MD;  Location: [facility] ENDOSCOPY;  Service: Gastroenterology;  Laterality: N/A;   EYE SURGERY     bilateral cataract   KNEE ARTHROSCOPY W/ ACL RECONSTRUCTION     LAPAROSCOPIC RIGHT HEMI COLECTOMY N/A 02/24/2021   Procedure: LAPAROSCOPIC RIGHT HEMI COLECTOMY;  Surgeon: Andria Meuse, MD;  Location: [facility] ORS;  Service: General;  Laterality: N/A;   TOTAL KNEE ARTHROPLASTY Right 12/21/2022   Procedure: TOTAL KNEE ARTHROPLASTY;  Surgeon: Reinaldo Berber, MD;  Location: [facility] ORS;  Service: Orthopedics;  Laterality: Right;  RNFA    SOCIAL HISTORY: Social History   Socioeconomic History   Marital status: Married    Spouse name: Cindee Lame   Number of children: Not on file   Years of education: Not on file   Highest education level: Not on file  Occupational History   Not on file  Tobacco Use    Smoking status: Every Day    Packs/day: 0.25    Years: 9.00    Additional pack years: 0.00    Total pack years: 2.25    Types: Cigarettes    Last attempt to quit: 01/25/2021    Years since quitting: 1.9   Smokeless tobacco: Never  Vaping Use   Vaping Use: Never used  Substance and Sexual Activity   Alcohol use: Yes    Alcohol/week: 2.0 standard drinks of alcohol    Types: 2 Glasses of wine per week    Comment: occasional glass of wine.   Drug use: Never   Sexual activity: Not on file  Other Topics Concern   Not on file  Social History Narrative   Not on file   Social Determinants of Health   Financial Resource Strain: Not on file  Food Insecurity: No Food Insecurity (12/21/2022)   Hunger Vital Sign    Worried About Running Out of Food in the Last Year: Never true    Ran Out of Food in the Last Year: Never true  Transportation Needs: No Transportation Needs (12/21/2022)   PRAPARE - Administrator, Civil Service (Medical): No    Lack of Transportation (Non-Medical): No  Physical Activity: Not on file  Stress: Not on file  Social Connections: Not on file  Intimate Partner Violence: Not At Risk (12/21/2022)   Humiliation, Afraid, Rape, and Kick questionnaire    Fear of Current or Ex-Partner: No    Emotionally Abused: No    Physically Abused: No    Sexually Abused: No    FAMILY HISTORY: Family History  Problem Relation Age of Onset   Cancer Neg Hx     ALLERGIES:  has No Known Allergies.  MEDICATIONS:  Current Outpatient Medications  Medication Sig Dispense Refill   acetaminophen (TYLENOL) 500 MG tablet Take 2 tablets (1,000 mg total) by mouth every 8 (eight) hours. 30 tablet 0   aspirin 81 MG chewable tablet Chew 81 mg by mouth daily.     atorvastatin (LIPITOR) 40 MG tablet Take 40 mg by mouth daily.     cetirizine (ZYRTEC) 10 MG tablet Take 10 mg by mouth daily as needed for allergies.     chlorthalidone (HYGROTON) 25 MG tablet Take 25 mg by mouth daily.      Cholecalciferol (VITAMIN D3) 25 MCG (1000 UT) CAPS Take 2,000 Units by mouth at bedtime.     cyanocobalamin (VITAMIN B12) 1000 MCG tablet Take 500 mcg by mouth daily.     docusate sodium (COLACE) 100 MG capsule Take 1 capsule (100 mg total) by  mouth 2 (two) times daily. 10 capsule 0   fluticasone (FLONASE) 50 MCG/ACT nasal spray Place 2 sprays into both nostrils daily.     Multiple Vitamin (MULTIVITAMIN) capsule Take 1 capsule by mouth daily.     Omega-3 1000 MG CAPS Take 1,000 mg by mouth daily.     omeprazole (PRILOSEC) 40 MG capsule Take 40 mg by mouth daily.     ondansetron (ZOFRAN) 4 MG tablet Take 1 tablet (4 mg total) by mouth every 6 (six) hours as needed for nausea. 20 tablet 0   potassium chloride SA (KLOR-CON M) 20 MEQ tablet Take 3 tablets (60 mEq) today, the 1 tablet (20 mEq) daily until surgery. Be sure to take dose on day of surgery. Follow up with PCP for repeat labs. 30 tablet 0   sertraline (ZOLOFT) 25 MG tablet Take 25 mg by mouth as needed.     traMADol (ULTRAM) 50 MG tablet Take 1 tablet (50 mg total) by mouth every 6 (six) hours as needed for moderate pain. 30 tablet 0   No current facility-administered medications for this visit.   Facility-Administered Medications Ordered in Other Visits  Medication Dose Route Frequency Provider Last Rate Last Admin   0.9 %  sodium chloride infusion   Intravenous Once Rickard Patience, MD         PHYSICAL EXAMINATION: ECOG PERFORMANCE STATUS: 1 - Symptomatic but completely ambulatory Vitals:   01/12/23 1027  BP: 125/78  Pulse: 93  Resp: 18  Temp: (!) 97.4 F (36.3 C)   Filed Weights   01/12/23 1027  Weight: 156 lb 9.6 oz (71 kg)    Physical Exam Constitutional:      General: She is not in acute distress. HENT:     Head: Normocephalic and atraumatic.  Eyes:     General: No scleral icterus. Cardiovascular:     Rate and Rhythm: Normal rate and regular rhythm.     Heart sounds: Normal heart sounds.  Pulmonary:     Effort:  Pulmonary effort is normal. No respiratory distress.     Breath sounds: No wheezing.  Abdominal:     General: Bowel sounds are normal. There is no distension.     Palpations: Abdomen is soft.  Musculoskeletal:        General: No deformity. Normal range of motion.     Cervical back: Normal range of motion and neck supple.     Comments: Right knee s/p TKR, surgical site is clean without discharges. Mild swelling.   Skin:    General: Skin is warm and dry.     Findings: No erythema or rash.  Neurological:     Mental Status: She is alert and oriented to person, place, and time. Mental status is at baseline.     Cranial Nerves: No cranial nerve deficit.     Coordination: Coordination normal.  Psychiatric:        Mood and Affect: Mood normal.     LABORATORY DATA:  I have reviewed the data as listed    Latest Ref Rng & Units 01/10/2023    8:43 AM 12/22/2022    3:42 AM 12/21/2022   11:44 AM  CBC  WBC 4.0 - 10.5 K/uL 8.8  15.3    Hemoglobin 12.0 - 15.0 g/dL 16.1  09.6  04.5   Hematocrit 36.0 - 46.0 % 38.5  34.0  40.0   Platelets 150 - 400 K/uL 360  225        Latest Ref Rng &  Units 01/10/2023    8:43 AM 12/22/2022    3:42 AM 12/21/2022   11:44 AM  CMP  Glucose 70 - 99 mg/dL 97  161  98   BUN 8 - 23 mg/dL 16  20  14    Creatinine 0.44 - 1.00 mg/dL 0.96  0.45  4.09   Sodium 135 - 145 mmol/L 138  139  140   Potassium 3.5 - 5.1 mmol/L 2.8  3.2  3.1   Chloride 98 - 111 mmol/L 96  104  102   CO2 22 - 32 mmol/L 27  22    Calcium 8.9 - 10.3 mg/dL 9.4  8.6    Total Protein 6.5 - 8.1 g/dL 7.7     Total Bilirubin 0.3 - 1.2 mg/dL 0.9     Alkaline Phos 38 - 126 U/L 80     AST 15 - 41 U/L 23     ALT 0 - 44 U/L 17       RADIOGRAPHIC STUDIES: I have personally reviewed the radiological images as listed and agreed with the findings in the report. CT CHEST ABDOMEN PELVIS W CONTRAST  Result Date: 01/08/2023 CLINICAL DATA:  Colon cancer restaging * Tracking Code: BO * EXAM: CT CHEST, ABDOMEN, AND  PELVIS WITH CONTRAST TECHNIQUE: Multidetector CT imaging of the chest, abdomen and pelvis was performed following the standard protocol during bolus administration of intravenous contrast. RADIATION DOSE REDUCTION: This exam was performed according to the departmental dose-optimization program which includes automated exposure control, adjustment of the mA and/or kV according to patient size and/or use of iterative reconstruction technique. CONTRAST:  OMNIPAQUE IOHEXOL 300 MG/ML SOLN additional oral enteric contrast COMPARISON:  07/10/2022 FINDINGS: CT CHEST FINDINGS Cardiovascular: Aortic atherosclerosis. Normal heart size. No pericardial effusion. Mediastinum/Nodes: No enlarged mediastinal, hilar, or axillary lymph nodes. Thyroid gland, trachea, and esophagus demonstrate no significant findings. Lungs/Pleura: Background of fine centrilobular nodularity, most concentrated in the lung apices. No pleural effusion or pneumothorax. Musculoskeletal: No chest wall abnormality. No acute osseous findings. CT ABDOMEN PELVIS FINDINGS Hepatobiliary: No solid liver abnormality is seen. Hepatic steatosis. Contracted gallbladder. No gallstones, gallbladder wall thickening, or biliary dilatation. Pancreas: Unremarkable. No pancreatic ductal dilatation or surrounding inflammatory changes. Spleen: Normal in size without significant abnormality. Adrenals/Urinary Tract: Adrenal glands are unremarkable. Kidneys are normal, without renal calculi, solid lesion, or hydronephrosis. Bladder is unremarkable. Stomach/Bowel: Stomach is within normal limits. Status post right hemicolectomy and reanastomosis. No evidence of bowel wall thickening, distention, or inflammatory changes. Vascular/Lymphatic: Aortic atherosclerosis. No enlarged abdominal or pelvic lymph nodes. Reproductive: No mass or other abnormality. Other: No abdominal wall hernia or abnormality. No ascites. Musculoskeletal: No acute osseous findings. IMPRESSION: 1. Status  post right hemicolectomy and reanastomosis. 2. No evidence of recurrent or metastatic disease in the chest, abdomen, or pelvis. 3. Background of fine centrilobular nodularity, most concentrated in the lung apices, most commonly seen in smoking-related respiratory bronchiolitis. 4. Hepatic steatosis. Aortic Atherosclerosis (ICD10-I70.0). Electronically Signed   By: Jearld Lesch M.D.   On: 01/08/2023 21:52   DG Knee Right Port  Result Date: 12/21/2022 CLINICAL DATA:  [id number] Surgery, elective [id number] EXAM: PORTABLE RIGHT KNEE - 1-2 VIEW COMPARISON:  April 06, 2022. FINDINGS: Status post knee arthroplasty. Orthopedic hardware is intact and without periprosthetic fracture or lucency. Retained screw of the distal femur status post prior ACL repair. Expected soft tissue air. IMPRESSION: Expected postsurgical changes status post right knee arthroplasty. Electronically Signed   By: Meda Klinefelter M.D.   On:  12/21/2022 15:15     

## 2023-01-12 NOTE — Assessment & Plan Note (Addendum)
#  Ascending colon carcinoma with mucinous feature, pT3 pN0 cMx, Stage II disease, no high risk features,  Labs reviewed and discussed with patient. CEA is chronically elevated, close to baseline.  Possible secondary to smoking history. Patient is doing very well clinically. May 2024 CT scan shows no signs of cancer recurrence. Continue surveillance.  Repeat CT in 6 months

## 2023-01-12 NOTE — Assessment & Plan Note (Signed)
Tobacco use, I encouraged her smoke cessation efforts.  Previous elevated CEA could be secondary to smoking.

## 2023-01-12 NOTE — Progress Notes (Signed)
Pt states Dr. Graciela Husbands thinks her BP med is what was causing low K level, so he has switched her to spironolactone. Pt has questions regarding medication. Pt had knee surgery about 3 weeks ago

## 2023-01-16 ENCOUNTER — Inpatient Hospital Stay: Payer: Medicare HMO | Admitting: Oncology

## 2023-01-16 DIAGNOSIS — I10 Essential (primary) hypertension: Secondary | ICD-10-CM | POA: Diagnosis not present

## 2023-01-16 DIAGNOSIS — I700 Atherosclerosis of aorta: Secondary | ICD-10-CM | POA: Diagnosis not present

## 2023-01-16 DIAGNOSIS — M1990 Unspecified osteoarthritis, unspecified site: Secondary | ICD-10-CM | POA: Diagnosis not present

## 2023-01-16 DIAGNOSIS — D649 Anemia, unspecified: Secondary | ICD-10-CM | POA: Diagnosis not present

## 2023-01-16 DIAGNOSIS — M25511 Pain in right shoulder: Secondary | ICD-10-CM | POA: Diagnosis not present

## 2023-01-16 DIAGNOSIS — R7303 Prediabetes: Secondary | ICD-10-CM | POA: Diagnosis not present

## 2023-01-16 DIAGNOSIS — Z471 Aftercare following joint replacement surgery: Secondary | ICD-10-CM | POA: Diagnosis not present

## 2023-01-16 DIAGNOSIS — E785 Hyperlipidemia, unspecified: Secondary | ICD-10-CM | POA: Diagnosis not present

## 2023-01-16 DIAGNOSIS — G8929 Other chronic pain: Secondary | ICD-10-CM | POA: Diagnosis not present

## 2023-01-17 ENCOUNTER — Ambulatory Visit: Payer: Medicare HMO

## 2023-01-17 ENCOUNTER — Ambulatory Visit: Payer: Medicare HMO | Admitting: Oncology

## 2023-01-18 DIAGNOSIS — E876 Hypokalemia: Secondary | ICD-10-CM | POA: Diagnosis not present

## 2023-01-18 DIAGNOSIS — Z96651 Presence of right artificial knee joint: Secondary | ICD-10-CM | POA: Diagnosis not present

## 2023-01-18 DIAGNOSIS — M25561 Pain in right knee: Secondary | ICD-10-CM | POA: Diagnosis not present

## 2023-01-24 DIAGNOSIS — M25561 Pain in right knee: Secondary | ICD-10-CM | POA: Diagnosis not present

## 2023-01-24 DIAGNOSIS — Z96651 Presence of right artificial knee joint: Secondary | ICD-10-CM | POA: Diagnosis not present

## 2023-01-26 DIAGNOSIS — Z96651 Presence of right artificial knee joint: Secondary | ICD-10-CM | POA: Diagnosis not present

## 2023-01-30 DIAGNOSIS — E876 Hypokalemia: Secondary | ICD-10-CM | POA: Diagnosis not present

## 2023-01-30 DIAGNOSIS — Z96651 Presence of right artificial knee joint: Secondary | ICD-10-CM | POA: Diagnosis not present

## 2023-01-30 DIAGNOSIS — M25561 Pain in right knee: Secondary | ICD-10-CM | POA: Diagnosis not present

## 2023-02-01 DIAGNOSIS — M25561 Pain in right knee: Secondary | ICD-10-CM | POA: Diagnosis not present

## 2023-02-01 DIAGNOSIS — Z96651 Presence of right artificial knee joint: Secondary | ICD-10-CM | POA: Diagnosis not present

## 2023-02-02 DIAGNOSIS — Z96651 Presence of right artificial knee joint: Secondary | ICD-10-CM | POA: Diagnosis not present

## 2023-02-06 DIAGNOSIS — Z96651 Presence of right artificial knee joint: Secondary | ICD-10-CM | POA: Diagnosis not present

## 2023-02-08 DIAGNOSIS — E538 Deficiency of other specified B group vitamins: Secondary | ICD-10-CM | POA: Diagnosis not present

## 2023-02-08 DIAGNOSIS — E7849 Other hyperlipidemia: Secondary | ICD-10-CM | POA: Diagnosis not present

## 2023-02-08 DIAGNOSIS — I700 Atherosclerosis of aorta: Secondary | ICD-10-CM | POA: Diagnosis not present

## 2023-02-08 DIAGNOSIS — Z96651 Presence of right artificial knee joint: Secondary | ICD-10-CM | POA: Diagnosis not present

## 2023-02-08 DIAGNOSIS — M25561 Pain in right knee: Secondary | ICD-10-CM | POA: Diagnosis not present

## 2023-02-08 DIAGNOSIS — E559 Vitamin D deficiency, unspecified: Secondary | ICD-10-CM | POA: Diagnosis not present

## 2023-02-20 DIAGNOSIS — I700 Atherosclerosis of aorta: Secondary | ICD-10-CM | POA: Diagnosis not present

## 2023-02-20 DIAGNOSIS — E785 Hyperlipidemia, unspecified: Secondary | ICD-10-CM | POA: Diagnosis not present

## 2023-02-20 DIAGNOSIS — Z780 Asymptomatic menopausal state: Secondary | ICD-10-CM | POA: Diagnosis not present

## 2023-03-05 DIAGNOSIS — M810 Age-related osteoporosis without current pathological fracture: Secondary | ICD-10-CM | POA: Diagnosis not present

## 2023-03-19 DIAGNOSIS — M7582 Other shoulder lesions, left shoulder: Secondary | ICD-10-CM | POA: Diagnosis not present

## 2023-03-19 DIAGNOSIS — M7581 Other shoulder lesions, right shoulder: Secondary | ICD-10-CM | POA: Diagnosis not present

## 2023-03-26 DIAGNOSIS — D2261 Melanocytic nevi of right upper limb, including shoulder: Secondary | ICD-10-CM | POA: Diagnosis not present

## 2023-03-26 DIAGNOSIS — D2262 Melanocytic nevi of left upper limb, including shoulder: Secondary | ICD-10-CM | POA: Diagnosis not present

## 2023-03-26 DIAGNOSIS — L853 Xerosis cutis: Secondary | ICD-10-CM | POA: Diagnosis not present

## 2023-03-26 DIAGNOSIS — D225 Melanocytic nevi of trunk: Secondary | ICD-10-CM | POA: Diagnosis not present

## 2023-03-26 DIAGNOSIS — D2271 Melanocytic nevi of right lower limb, including hip: Secondary | ICD-10-CM | POA: Diagnosis not present

## 2023-03-26 DIAGNOSIS — D2272 Melanocytic nevi of left lower limb, including hip: Secondary | ICD-10-CM | POA: Diagnosis not present

## 2023-04-04 DIAGNOSIS — Z96651 Presence of right artificial knee joint: Secondary | ICD-10-CM | POA: Diagnosis not present

## 2023-05-14 DIAGNOSIS — M810 Age-related osteoporosis without current pathological fracture: Secondary | ICD-10-CM | POA: Diagnosis not present

## 2023-05-14 DIAGNOSIS — E559 Vitamin D deficiency, unspecified: Secondary | ICD-10-CM | POA: Diagnosis not present

## 2023-05-23 DIAGNOSIS — M7631 Iliotibial band syndrome, right leg: Secondary | ICD-10-CM | POA: Diagnosis not present

## 2023-05-23 DIAGNOSIS — Z96651 Presence of right artificial knee joint: Secondary | ICD-10-CM | POA: Diagnosis not present

## 2023-06-28 DIAGNOSIS — Z9049 Acquired absence of other specified parts of digestive tract: Secondary | ICD-10-CM | POA: Diagnosis not present

## 2023-06-28 DIAGNOSIS — Z860101 Personal history of adenomatous and serrated colon polyps: Secondary | ICD-10-CM | POA: Diagnosis not present

## 2023-06-28 DIAGNOSIS — C182 Malignant neoplasm of ascending colon: Secondary | ICD-10-CM | POA: Diagnosis not present

## 2023-07-16 ENCOUNTER — Ambulatory Visit: Admission: RE | Admit: 2023-07-16 | Payer: Medicare HMO | Source: Ambulatory Visit

## 2023-07-16 ENCOUNTER — Inpatient Hospital Stay: Payer: Medicare HMO

## 2023-07-16 DIAGNOSIS — R059 Cough, unspecified: Secondary | ICD-10-CM | POA: Diagnosis not present

## 2023-07-16 DIAGNOSIS — J329 Chronic sinusitis, unspecified: Secondary | ICD-10-CM | POA: Diagnosis not present

## 2023-07-20 ENCOUNTER — Telehealth: Payer: Self-pay | Admitting: Emergency Medicine

## 2023-07-20 ENCOUNTER — Ambulatory Visit
Admission: EM | Admit: 2023-07-20 | Discharge: 2023-07-20 | Disposition: A | Discharge status: Home / Self Care | Payer: Medicare HMO | Attending: Emergency Medicine | Admitting: Emergency Medicine

## 2023-07-20 ENCOUNTER — Inpatient Hospital Stay: Admit: 2023-07-20 | Payer: Medicare HMO

## 2023-07-20 ENCOUNTER — Ambulatory Visit: Admission: AD | Admit: 2023-07-20 | Payer: Medicare HMO

## 2023-07-20 ENCOUNTER — Ambulatory Visit
Admit: 2023-07-20 | Discharge: 2023-07-20 | Disposition: A | Discharge status: Home / Self Care | Payer: Medicare HMO | Attending: Emergency Medicine | Admitting: Emergency Medicine

## 2023-07-20 DIAGNOSIS — R0602 Shortness of breath: Secondary | ICD-10-CM | POA: Diagnosis not present

## 2023-07-20 DIAGNOSIS — R062 Wheezing: Secondary | ICD-10-CM | POA: Diagnosis not present

## 2023-07-20 DIAGNOSIS — R059 Cough, unspecified: Secondary | ICD-10-CM | POA: Diagnosis not present

## 2023-07-20 DIAGNOSIS — J209 Acute bronchitis, unspecified: Secondary | ICD-10-CM | POA: Diagnosis not present

## 2023-07-20 DIAGNOSIS — R7981 Abnormal blood-gas level: Secondary | ICD-10-CM | POA: Diagnosis not present

## 2023-07-20 MED ORDER — ALBUTEROL SULFATE HFA 108 (90 BASE) MCG/ACT IN AERS: 1.0000 | INHALATION_SPRAY | Freq: Four times a day (QID) | RESPIRATORY_TRACT | 0 refills | Status: DC | PRN

## 2023-07-20 MED ORDER — ALBUTEROL SULFATE (2.5 MG/3ML) 0.083% IN NEBU
2.5000 mg | INHALATION_SOLUTION | Freq: Once | RESPIRATORY_TRACT | Status: AC
  Administered 2023-07-20: 2.5 mg via RESPIRATORY_TRACT

## 2023-07-20 MED ORDER — PREDNISONE 10 MG (21) PO TBPK
ORAL_TABLET | Freq: Every day | ORAL | 0 refills | Status: DC
Start: 2023-07-20 — End: 2023-08-10

## 2023-07-20 MED ORDER — AZITHROMYCIN 250 MG PO TABS: 250.0000 mg | ORAL_TABLET | Freq: Every day | ORAL | 0 refills | Status: DC

## 2023-07-20 NOTE — ED Triage Notes (Signed)
Pt presents with cough x 1 week. Was seen at [facility] on 11/25 and given cough med and abx. She has been taking both as prescribed x 4 days. Reports worsening cough and states she is exhausted, ribs hurt, aching (she thinks from coughing so much). Home COVID last night negative, 11/25 flu/covid negative.

## 2023-07-20 NOTE — ED Provider Notes (Signed)
Katie Liu    CSN: [id number] Arrival date & time: 07/20/23  1138      History   Chief Complaint Chief Complaint  Patient presents with   Cough    HPI Katie Liu is a 72 y.o. female.  Patient presents with 8-day history of congestion and cough.  She has been on an antibiotic x 4 days.  She states her symptoms are not improving.  She is sore from coughing and occasionally feels short of breath.  Negative COVID test at home and at previous visit to another urgent care.  She denies fever, chest pain, or other symptoms.  Patient was seen at [facility] on 07/16/2023; diagnosed with cough and sinusitis; treated with Augmentin and Promethazine DM.  The history is provided by the patient and medical records.    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   Hyperlipidemia 03/02/2021   Colon cancer (HCC) 02/24/2021   Malignant neoplasm of ascending colon (HCC) 01/11/2021   Goals of care, counseling/discussion 01/11/2021   Leukocytosis 01/11/2021   Aortic atherosclerosis (HCC) 06/21/2018   Chronic right shoulder pain 04/04/2017   Tobacco use 01/18/2017   Vitamin D deficiency 01/18/2017    Past Surgical History:  Procedure Laterality Date   ACHILLES TENDON REPAIR Bilateral    CESAREAN SECTION     COLON SURGERY     COLONOSCOPY WITH PROPOFOL N/A 01/03/2021   Procedure: COLONOSCOPY WITH PROPOFOL;  Surgeon: Toledo, Boykin Nearing, MD;  Location: [facility];  Service: Gastroenterology;  Laterality: N/A;   ESOPHAGOGASTRODUODENOSCOPY (EGD) WITH PROPOFOL N/A  01/03/2021   Procedure: ESOPHAGOGASTRODUODENOSCOPY (EGD) WITH PROPOFOL;  Surgeon: Toledo, Boykin Nearing, MD;  Location: [facility];  Service: Gastroenterology;  Laterality: N/A;   EYE SURGERY     bilateral cataract   KNEE ARTHROSCOPY W/ ACL RECONSTRUCTION     LAPAROSCOPIC RIGHT HEMI COLECTOMY N/A 02/24/2021   Procedure: LAPAROSCOPIC RIGHT HEMI COLECTOMY;  Surgeon: Andria Meuse, MD;  Location: [facility];  Service: General;  Laterality: N/A;   TOTAL KNEE ARTHROPLASTY Right 12/21/2022   Procedure: TOTAL KNEE ARTHROPLASTY;  Surgeon: Reinaldo Berber, MD;  Location: [facility];  Service: Orthopedics;  Laterality: Right;  RNFA    OB History   No obstetric history on file.      Home Medications    Prior to Admission medications   Medication Sig Start Date End Date Taking? Authorizing Provider  acetaminophen (TYLENOL) 500 MG tablet Take 2 tablets (1,000 mg total) by mouth every 8 (eight) hours. 12/22/22  Yes Evon Slack, PA-C  albuterol (VENTOLIN HFA) 108 (90 Base) MCG/ACT inhaler Inhale 1-2 puffs into the lungs every 6 (six) hours as needed. 07/20/23  Yes Mickie Bail, NP  amoxicillin-clavulanate (AUGMENTIN) 875-125 MG tablet Take 1 tablet by mouth 2 (two) times daily.   Yes [provider]  aspirin 81 MG chewable tablet Chew 81 mg by mouth daily.   Yes [provider]  atorvastatin (LIPITOR) 40 MG tablet Take  40 mg by mouth daily.   Yes [provider]  cetirizine (ZYRTEC) 10 MG tablet Take 10 mg by mouth daily as needed for allergies.   Yes [provider]  chlorthalidone (HYGROTON) 25 MG tablet Take 25 mg by mouth daily.   Yes [provider]  Cholecalciferol (VITAMIN D3) 25 MCG (1000 UT) CAPS Take 2,000 Units by mouth at bedtime.   Yes [provider]  cyanocobalamin (VITAMIN B12) 1000 MCG tablet Take 500 mcg by mouth daily.   Yes [provider]  fluticasone (FLONASE) 50 MCG/ACT nasal spray Place 2 sprays into both  nostrils daily.   Yes [provider]  Multiple Vitamin (MULTIVITAMIN) capsule Take 1 capsule by mouth daily.   Yes [provider]  predniSONE (STERAPRED UNI-PAK 21 TAB) 10 MG (21) TBPK tablet Take by mouth daily. As directed 07/20/23  Yes Mickie Bail, NP  promethazine-dextromethorphan (PROMETHAZINE-DM) 6.25-15 MG/5ML syrup Take 2.5 mLs by mouth 4 (four) times daily as needed for cough.   Yes [provider]  sertraline (ZOLOFT) 25 MG tablet Take 25 mg by mouth as needed.   Yes [provider]  docusate sodium (COLACE) 100 MG capsule Take 1 capsule (100 mg total) by mouth 2 (two) times daily. 12/22/22   Evon Slack, PA-C  Omega-3 1000 MG CAPS Take 1,000 mg by mouth daily.    [provider]  omeprazole (PRILOSEC) 40 MG capsule Take 40 mg by mouth daily.    [provider]  ondansetron (ZOFRAN) 4 MG tablet Take 1 tablet (4 mg total) by mouth every 6 (six) hours as needed for nausea. 12/22/22   Evon Slack, PA-C  potassium chloride SA (KLOR-CON M) 20 MEQ tablet Take 3 tablets (60 mEq) today, the 1 tablet (20 mEq) daily until surgery. Be sure to take dose on day of surgery. Follow up with PCP for repeat labs. 12/08/22   Verlee Monte, NP  traMADol (ULTRAM) 50 MG tablet Take 1 tablet (50 mg total) by mouth every 6 (six) hours as needed for moderate pain. 12/22/22   Evon Slack, PA-C    Family History Family History  Problem Relation Age of Onset   Cancer Neg Hx     Social History Social History   Tobacco Use   Smoking status: Former    Current packs/day: 0.00    Average packs/day: 0.3 packs/day for 10.0 years (2.5 ttl pk-yrs)    Types: Cigarettes    Start date: 01/26/2012    Quit date: 06/22/2023    Years since quitting: 0.0   Smokeless tobacco: Never  Vaping Use   Vaping status: Never Used  Substance Use Topics   Alcohol use: Yes    Alcohol/week: 2.0 standard drinks of alcohol    Types: 2 Glasses of wine per week     Comment: occasional glass of wine.   Drug use: Never     Allergies   Patient has no known allergies.   Review of Systems Review of Systems  Constitutional:  Negative for chills and fever.  HENT:  Positive for congestion. Negative for ear pain and sore throat.   Respiratory:  Positive for cough and shortness of breath.   Cardiovascular:  Negative for chest pain and palpitations.     Physical Exam Triage Vital Signs ED Triage Vitals  Encounter Vitals Group     BP 07/20/23 1241 125/81     Systolic BP Percentile --      Diastolic BP Percentile --  Pulse Rate 07/20/23 1241 100     Resp 07/20/23 1241 (!) 22     Temp 07/20/23 1241 98.9 F (37.2 C)     Temp Source 07/20/23 1241 Oral     SpO2 07/20/23 1241 93 %     Weight --      Height --      Head Circumference --      Peak Flow --      Pain Score 07/20/23 1233 6     Pain Loc --      Pain Education --      Exclude from Growth Chart --    No data found.  Updated Vital Signs BP 125/81 (BP Location: Left Arm)   Pulse (!) 105   Temp 98.9 F (37.2 C) (Oral)   Resp 20   SpO2 91%   Visual Acuity Right Eye Distance:   Left Eye Distance:   Bilateral Distance:    Right Eye Near:   Left Eye Near:    Bilateral Near:     Physical Exam Constitutional:      General: She is not in acute distress. HENT:     Right Ear: Tympanic membrane normal.     Left Ear: Tympanic membrane normal.     Nose: Nose normal.     Mouth/Throat:     Mouth: Mucous membranes are moist.     Pharynx: Oropharynx is clear.  Cardiovascular:     Rate and Rhythm: Normal rate and regular rhythm.     Heart sounds: Normal heart sounds.  Pulmonary:     Effort: Tachypnea present. No respiratory distress.     Breath sounds: Decreased air movement present. Wheezing and rhonchi present.     Comments: Few scattered wheezes and rhonchi.  After albuterol nebulizer treatment, improved air movement and decreased wheezes and rhonchi.  O2 sat 96% after  treatment. Neurological:     Mental Status: She is alert.      UC Treatments / Results  Labs (all labs ordered are listed, but only abnormal results are displayed) Labs Reviewed - No data to display  EKG   Radiology DG Chest 2 View  Result Date: 07/20/2023 CLINICAL DATA:  Cough and wheezing. EXAM: CHEST - 2 VIEW COMPARISON:  04/17/2018.  CT, 01/08/2023. FINDINGS: Cardiac silhouette is normal in size and configuration. Normal mediastinal and hilar contours. Lungs are hyperexpanded, but clear. No pleural effusion or pneumothorax. Skeletal structures are intact. IMPRESSION: 1. No active cardiopulmonary disease. Electronically Signed   By: Amie Portland M.D.   On: 07/20/2023 15:07    Procedures Procedures (including critical care time)  Medications Ordered in UC Medications  albuterol (PROVENTIL) (2.5 MG/3ML) 0.083% nebulizer solution 2.5 mg (2.5 mg Nebulization Given 07/20/23 1300)    Initial Impression / Assessment and Plan / UC Course  I have reviewed the triage vital signs and the nursing notes.  Pertinent labs & imaging results that were available during my care of the patient were reviewed by me and considered in my medical decision making (see chart for details).    Acute bronchitis, shortness of breath, borderline low oxygen saturation level.  Patient declines transfer to the ED.  O2 sat was 93% on room air on arrival.  After nebulizer treatment, lung sounds improved but O2 sat remains 91-93%.  CXR negative.  Treating with albuterol inhaler, prednisone, Zithromax.  Instructed patient to follow-up with her PCP on Monday.  ED precautions given.  Education provided on bronchitis and shortness of breath.  Patient agrees to plan of care.   Final Clinical Impressions(s) / UC Diagnoses   Final diagnoses:  Acute bronchitis, unspecified organism  Shortness of breath  Borderline low oxygen saturation level     Discharge Instructions      Take the prednisone and use the  albuterol inhaler as directed.    Go to [facility] for your [facility] xray.  I will call you with the result.    Follow up with your primary care provider tomorrow.  Go to the emergency department if you have worsening symptoms.        ED Prescriptions     Medication Sig Dispense Auth. Provider   albuterol (VENTOLIN HFA) 108 (90 Base) MCG/ACT inhaler Inhale 1-2 puffs into the lungs every 6 (six) hours as needed. 18 g Mickie Bail, NP   predniSONE (STERAPRED UNI-PAK 21 TAB) 10 MG (21) TBPK tablet Take by mouth daily. As directed 21 tablet Mickie Bail, NP      PDMP not reviewed this encounter.   Mickie Bail, NP 07/20/23 1539

## 2023-07-20 NOTE — Discharge Instructions (Addendum)
Take the prednisone and use the albuterol inhaler as directed.    Go to [facility] for your chest xray.  I will call you with the result.    Follow up with your primary care provider tomorrow.  Go to the emergency department if you have worsening symptoms.

## 2023-07-20 NOTE — Telephone Encounter (Signed)
Zithromax sent to pharmacy.

## 2023-07-21 ENCOUNTER — Ambulatory Visit: Payer: Self-pay

## 2023-07-23 ENCOUNTER — Inpatient Hospital Stay: Payer: Medicare HMO | Admitting: Oncology

## 2023-07-26 DIAGNOSIS — Z0000 Encounter for general adult medical examination without abnormal findings: Secondary | ICD-10-CM | POA: Diagnosis not present

## 2023-07-26 DIAGNOSIS — E559 Vitamin D deficiency, unspecified: Secondary | ICD-10-CM | POA: Diagnosis not present

## 2023-07-26 DIAGNOSIS — E785 Hyperlipidemia, unspecified: Secondary | ICD-10-CM | POA: Diagnosis not present

## 2023-07-26 DIAGNOSIS — M1993 Secondary osteoarthritis, unspecified site: Secondary | ICD-10-CM | POA: Diagnosis not present

## 2023-07-26 DIAGNOSIS — F17210 Nicotine dependence, cigarettes, uncomplicated: Secondary | ICD-10-CM | POA: Diagnosis not present

## 2023-07-26 DIAGNOSIS — Z1331 Encounter for screening for depression: Secondary | ICD-10-CM | POA: Diagnosis not present

## 2023-07-26 DIAGNOSIS — I10 Essential (primary) hypertension: Secondary | ICD-10-CM | POA: Diagnosis not present

## 2023-07-26 DIAGNOSIS — R739 Hyperglycemia, unspecified: Secondary | ICD-10-CM | POA: Diagnosis not present

## 2023-07-26 DIAGNOSIS — M1990 Unspecified osteoarthritis, unspecified site: Secondary | ICD-10-CM | POA: Diagnosis not present

## 2023-07-26 DIAGNOSIS — I700 Atherosclerosis of aorta: Secondary | ICD-10-CM | POA: Diagnosis not present

## 2023-07-27 ENCOUNTER — Other Ambulatory Visit: Payer: Medicare HMO

## 2023-07-27 ENCOUNTER — Ambulatory Visit: Payer: Medicare HMO

## 2023-08-02 ENCOUNTER — Encounter: Payer: Self-pay | Admitting: Oncology

## 2023-08-02 NOTE — Telephone Encounter (Signed)
FYI

## 2023-08-03 ENCOUNTER — Ambulatory Visit
Admission: RE | Admit: 2023-08-03 | Discharge: 2023-08-03 | Disposition: A | Discharge status: Home / Self Care | Payer: Medicare HMO | Source: Ambulatory Visit | Attending: Oncology | Admitting: Oncology

## 2023-08-03 ENCOUNTER — Inpatient Hospital Stay: Payer: Medicare HMO | Attending: Oncology

## 2023-08-03 DIAGNOSIS — C182 Malignant neoplasm of ascending colon: Secondary | ICD-10-CM

## 2023-08-03 DIAGNOSIS — I700 Atherosclerosis of aorta: Secondary | ICD-10-CM | POA: Diagnosis not present

## 2023-08-03 DIAGNOSIS — F17210 Nicotine dependence, cigarettes, uncomplicated: Secondary | ICD-10-CM | POA: Insufficient documentation

## 2023-08-03 DIAGNOSIS — Z85038 Personal history of other malignant neoplasm of large intestine: Secondary | ICD-10-CM | POA: Insufficient documentation

## 2023-08-03 DIAGNOSIS — K439 Ventral hernia without obstruction or gangrene: Secondary | ICD-10-CM | POA: Diagnosis not present

## 2023-08-03 LAB — CMP (CANCER CENTER ONLY)
ALT: 33 U/L (ref 0–44)
AST: 20 U/L (ref 15–41)
Albumin: 4.1 g/dL (ref 3.5–5.0)
Alkaline Phosphatase: 71 U/L (ref 38–126)
Anion gap: 11 (ref 5–15)
BUN: 17 mg/dL (ref 8–23)
CO2: 25 mmol/L (ref 22–32)
Calcium: 9.1 mg/dL (ref 8.9–10.3)
Chloride: 102 mmol/L (ref 98–111)
Creatinine: 0.83 mg/dL (ref 0.44–1.00)
GFR, Estimated: >60 mL/min (ref >60)
Glucose, Bld: 89 mg/dL (ref 70–99)
Potassium: 3.9 mmol/L (ref 3.5–5.1)
Sodium: 138 mmol/L (ref 135–145)
Total Bilirubin: 1.1 mg/dL (ref <1.2)
Total Protein: 7 g/dL (ref 6.5–8.1)

## 2023-08-03 LAB — CBC WITH DIFFERENTIAL (CANCER CENTER ONLY)
Abs Immature Granulocytes: 0.12 10*3/uL — ABNORMAL HIGH (ref 0.00–0.07)
Basophils Absolute: 0.1 10*3/uL (ref 0.0–0.1)
Basophils Relative: 1 %
Eosinophils Absolute: 0.2 10*3/uL (ref 0.0–0.5)
Eosinophils Relative: 1 %
HCT: 43.9 % (ref 36.0–46.0)
Hemoglobin: 14.7 g/dL (ref 12.0–15.0)
Immature Granulocytes: 1 %
Lymphocytes Relative: 27 %
Lymphs Abs: 4 10*3/uL (ref 0.7–4.0)
MCH: 32.2 pg (ref 26.0–34.0)
MCHC: 33.5 g/dL (ref 30.0–36.0)
MCV: 96.3 fL (ref 80.0–100.0)
Monocytes Absolute: 0.8 10*3/uL (ref 0.1–1.0)
Monocytes Relative: 6 %
Neutro Abs: 9.5 10*3/uL — ABNORMAL HIGH (ref 1.7–7.7)
Neutrophils Relative %: 64 %
Platelet Count: 273 10*3/uL (ref 150–400)
RBC: 4.56 MIL/uL (ref 3.87–5.11)
RDW: 12.6 % (ref 11.5–15.5)
WBC Count: 14.8 10*3/uL — ABNORMAL HIGH (ref 4.0–10.5)
nRBC: 0 % (ref 0.0–0.2)

## 2023-08-03 MED ORDER — IOHEXOL 300 MG/ML  SOLN
100.0000 mL | Freq: Once | INTRAMUSCULAR | Status: AC | PRN
  Administered 2023-08-03: 100 mL via INTRAVENOUS

## 2023-08-05 LAB — CEA: CEA: 4.4 ng/mL (ref 0.0–4.7)

## 2023-08-06 ENCOUNTER — Ambulatory Visit: Payer: Medicare HMO | Admitting: Oncology

## 2023-08-10 ENCOUNTER — Inpatient Hospital Stay: Payer: Medicare HMO | Admitting: Oncology

## 2023-08-10 ENCOUNTER — Encounter: Payer: Self-pay | Admitting: Oncology

## 2023-08-10 VITALS — BP 126/82 | HR 85 | Temp 96.6°F | Resp 18 | Wt 169.7 lb

## 2023-08-10 DIAGNOSIS — F17210 Nicotine dependence, cigarettes, uncomplicated: Secondary | ICD-10-CM | POA: Diagnosis not present

## 2023-08-10 DIAGNOSIS — Z87891 Personal history of nicotine dependence: Secondary | ICD-10-CM

## 2023-08-10 DIAGNOSIS — Z85038 Personal history of other malignant neoplasm of large intestine: Secondary | ICD-10-CM | POA: Diagnosis not present

## 2023-08-10 DIAGNOSIS — C182 Malignant neoplasm of ascending colon: Secondary | ICD-10-CM | POA: Diagnosis not present

## 2023-08-10 DIAGNOSIS — D72829 Elevated white blood cell count, unspecified: Secondary | ICD-10-CM | POA: Diagnosis not present

## 2023-08-10 NOTE — Assessment & Plan Note (Addendum)
#  Ascending colon carcinoma with mucinous feature, pT3 pN0 cMx, Stage II disease, no high risk features,  Labs reviewed and discussed with patient. CEA is chronically elevated, decreased, likely due to smoke cessation.  Patient is doing very well clinically. Dec 2024 CT scan shows no signs of cancer recurrence. Continue surveillance.  Repeat CT in 6 months

## 2023-08-10 NOTE — Progress Notes (Signed)
Hematology/Oncology Progress note [phone] [phone]       Patient Care Team: Lynnea Ferrier, MD as PCP - General (Internal Medicine) Benita Gutter, RN as Oncology Nurse Navigator Rickard Patience, MD as Consulting Physician (Oncology)  ASSESSMENT & PLAN:   Cancer Staging  Malignant neoplasm of ascending colon [facility]) Staging form: Colon and Rectum, AJCC 8th Edition - Pathologic stage from 03/02/2021: Stage IIA (pT3, pN0, cM0) - Signed by Rickard Patience, MD on 03/02/2021    Malignant neoplasm of ascending colon (HCC) #Ascending colon carcinoma with mucinous feature, pT3 pN0 cMx, Stage II disease, no high risk features,  Labs reviewed and discussed with patient. CEA is chronically elevated, decreased, likely due to smoke cessation.  Patient is doing very well clinically. Dec 2024 CT scan shows no signs of cancer recurrence. Continue surveillance.  Repeat CT in 6 months   Former smoker Encourage her smoking cessation effort.   Leukocytosis Likely reactive, due to recent URI and steroid use.     Orders Placed This Encounter  Procedures   CT CHEST ABDOMEN PELVIS W CONTRAST    Standing Status:   Future    Expected Date:   02/08/2024    Expiration Date:   08/09/2024    If indicated for the ordered procedure, I authorize the administration of contrast media per Radiology protocol:   Yes    Does the patient have a contrast media/X-ray dye allergy?:   No    Preferred imaging location?:   [facility]    If indicated for the ordered procedure, I authorize the administration of oral contrast media per Radiology protocol:   Yes   CBC with Differential (Cancer Center Only)    Standing Status:   Future    Expected Date:   02/08/2024    Expiration Date:   08/09/2024   CMP (Cancer Center only)    Standing Status:   Future    Expected Date:   02/08/2024    Expiration Date:   08/09/2024   CEA    Standing Status:   Future    Expected Date:   02/08/2024     Expiration Date:   08/09/2024   Follow-up in 6 months All questions were answered. The patient knows to call the clinic with any problems, questions or concerns.  Rickard Patience, MD, PhD [facility] Hematology Oncology 08/10/2023   CHIEF COMPLAINTS/REASON FOR VISIT:  right colon cancer follow up  HISTORY OF PRESENTING ILLNESS:   Arriyah KOLLEEN Liu is a  72 y.o.  female presents for follow up of Stage II right colon cancer Oncology History  Malignant neoplasm of ascending colon (HCC)  12/30/2020 Imaging   CT abdomen/pelvis angiogram was obtained for evaluation of postprandial abdominal pain. There was no significant narrowing of the mesenteric arteries or veins.  Masslike thickening of the wall of the cecum spacious for malignancy   01/11/2021 Initial Diagnosis   Malignant neoplasm of ascending colon  -12/21/2020, patient was referred to see [facility] gastroenterology Dr. Fannie Knee for evaluation of generalized postprandial abdominal pain and unintentional weight loss.  Symptom onset is 2 to 3 months, some nausea without emesis.  20 pounds of unintentional weight loss within the past few months.  Early satiety, only eating one third of her plate.   -1/91/4782, EGD showed esophageal mucosal.  1cm hiatal hernia Colonoscopy findings includes 7 mm polyp in the rectum, resected and retrieved, nonbleeding internal -pathology showed tubular adenoma negative for high-grade dysplasia and malignancy.  Hemorrhoids. Malignant  partially obstructing tumor in the proximal ascending colon.  Tattooed and biopsied. -Proximal ascending colon mass biopsy pathology showed invasive moderately differentiated adenocarcinoma   02/24/2021 Surgery   patient underwent right hemicolectomy. Invasive adenocarcinoma with mucinous features 8cm, grade 2, negative LVI or perineural invasion. carcinoma extends into pericolonic connective tissue. Margin is negative. 0/21 lymph nodes positive.  pT3 pN0, stage II       03/02/2021 Cancer Staging   Staging form: Colon and Rectum, AJCC 8th Edition - Pathologic stage from 03/02/2021: Stage IIA (pT3, pN0, cM0) - Signed by Rickard Patience, MD on 03/02/2021 Stage prefix: Initial diagnosis Total positive nodes: 0   12/05/2021 Imaging   CT chest abdomen pelvis with contrast showed no evidence of recurrence or metastatic disease in the chest, abdomen or pelvis.    07/10/2022 Imaging   CT chest abdomen pelvis with contrast 1. Prior right hemicolectomy with ileocolic anastomosis without evidence of local recurrence. 2. No evidence of metastatic disease within the chest, abdomen, or pelvis. 3. Moderate volume of formed stool throughout the colon suggestive of constipation. 4.  Aortic Atherosclerosis .   01/08/2023 Imaging   CT chest abdomen pelvis w contrast  1. Status post right hemicolectomy and reanastomosis. 2. No evidence of recurrent or metastatic disease in the chest,abdomen, or pelvis. 3. Background of fine centrilobular nodularity, most concentrated in the lung apices, most commonly seen in smoking-related respiratorybronchiolitis. 4. Hepatic steatosis      INTERVAL HISTORY Katie Liu is a 72 y.o. female who has above history reviewed by me today presents for follow up visit for management of stage II right colon cancer She had acute bronchitis and took a course of prednisone, and antibitoics. Symptoms are better. She has stopped smoking for 5 weeks, and feels well today. She has good appetite, Denies blood in the stool, abdominal pain. .     Review of Systems  Constitutional:  Negative for appetite change, chills, fatigue, fever and unexpected weight change.  HENT:   Negative for hearing loss and voice change.   Eyes:  Negative for eye problems.  Respiratory:  Negative for chest tightness and cough.   Cardiovascular:  Negative for chest pain.  Gastrointestinal:  Negative for abdominal distention, abdominal pain and blood in stool.  Endocrine:  Negative for hot flashes.  Genitourinary:  Negative for difficulty urinating and frequency.   Musculoskeletal:  Negative for arthralgias.       Right knee TKR  Skin:  Negative for itching and rash.  Neurological:  Negative for extremity weakness.  Hematological:  Negative for adenopathy.  Psychiatric/Behavioral:  Negative for confusion.     MEDICAL HISTORY:  Past Medical History:  Diagnosis Date   Allergy    Anemia    Anxiety    Arthritis    Cancer (HCC)    colon   Complication of anesthesia    started to feel when having her cataract surgery   GERD (gastroesophageal reflux disease)    Hyperlipemia    Hypertension    Pre-diabetes    Vitamin D deficiency     SURGICAL HISTORY: Past Surgical History:  Procedure Laterality Date   ACHILLES TENDON REPAIR Bilateral    CESAREAN SECTION     COLON SURGERY     COLONOSCOPY WITH PROPOFOL N/A 01/03/2021   Procedure: COLONOSCOPY WITH PROPOFOL;  Surgeon: Toledo, Boykin Nearing, MD;  Location: [facility] ENDOSCOPY;  Service: Gastroenterology;  Laterality: N/A;   ESOPHAGOGASTRODUODENOSCOPY (EGD) WITH PROPOFOL N/A 01/03/2021   Procedure: ESOPHAGOGASTRODUODENOSCOPY (EGD) WITH PROPOFOL;  Surgeon: Toledo, Boykin Nearing, MD;  Location: [facility] ENDOSCOPY;  Service: Gastroenterology;  Laterality: N/A;   EYE SURGERY     bilateral cataract   KNEE ARTHROSCOPY W/ ACL RECONSTRUCTION     LAPAROSCOPIC RIGHT HEMI COLECTOMY N/A 02/24/2021   Procedure: LAPAROSCOPIC RIGHT HEMI COLECTOMY;  Surgeon: Andria Meuse, MD;  Location: [facility] ORS;  Service: General;  Laterality: N/A;   TOTAL KNEE ARTHROPLASTY Right 12/21/2022   Procedure: TOTAL KNEE ARTHROPLASTY;  Surgeon: Reinaldo Berber, MD;  Location: [facility] ORS;  Service: Orthopedics;  Laterality: Right;  RNFA    SOCIAL HISTORY: Social History   Socioeconomic History   Marital status: Married    Spouse name: Cindee Lame   Number of children: Not on file   Years of education: Not on file   Highest education level: Not on file   Occupational History   Not on file  Tobacco Use   Smoking status: Former    Current packs/day: 0.00    Average packs/day: 0.3 packs/day for 10.0 years (2.5 ttl pk-yrs)    Types: Cigarettes    Start date: 01/26/2012    Quit date: 06/22/2023    Years since quitting: 0.1   Smokeless tobacco: Never  Vaping Use   Vaping status: Never Used  Substance and Sexual Activity   Alcohol use: Yes    Alcohol/week: 2.0 standard drinks of alcohol    Types: 2 Glasses of wine per week    Comment: occasional glass of wine.   Drug use: Never   Sexual activity: Not on file  Other Topics Concern   Not on file  Social History Narrative   Not on file   Social Drivers of Health   Financial Resource Strain: Low Risk  (05/10/2023)   Received from [facility]   Overall Financial Resource Strain (CARDIA)    Difficulty of Paying Living Expenses: Not hard at all  Food Insecurity: No Food Insecurity (05/10/2023)   Received from [facility]   Hunger Vital Sign    Worried About Running Out of Food in the Last Year: Never true    Ran Out of Food in the Last Year: Never true  Transportation Needs: No Transportation Needs (05/10/2023)   Received from [facility] - Transportation    In the past 12 months, has lack of transportation kept you from medical appointments or from getting medications?: No    Lack of Transportation (Non-Medical): No  Physical Activity: Not on file  Stress: Not on file  Social Connections: Not on file  Intimate Partner Violence: Not At Risk (12/21/2022)   Humiliation, Afraid, Rape, and Kick questionnaire    Fear of Current or Ex-Partner: No    Emotionally Abused: No    Physically Abused: No    Sexually Abused: No    FAMILY HISTORY: Family History  Problem Relation Age of Onset   Cancer Neg Hx     ALLERGIES:  has no known allergies.  MEDICATIONS:  Current Outpatient Medications  Medication Sig Dispense Refill    acetaminophen (TYLENOL) 500 MG tablet Take 2 tablets (1,000 mg total) by mouth every 8 (eight) hours. 30 tablet 0   aspirin 81 MG chewable tablet Chew 81 mg by mouth daily.     atorvastatin (LIPITOR) 40 MG tablet Take 40 mg by mouth daily.     cetirizine (ZYRTEC) 10 MG tablet Take 10 mg by mouth daily as needed for allergies.     Cholecalciferol (VITAMIN D3) 25 MCG (  1000 UT) CAPS Take 2,000 Units by mouth at bedtime.     cyanocobalamin (VITAMIN B12) 1000 MCG tablet Take 500 mcg by mouth daily.     fluticasone (FLONASE) 50 MCG/ACT nasal spray Place 2 sprays into both nostrils daily.     Multiple Vitamin (MULTIVITAMIN) capsule Take 1 capsule by mouth daily.     Omega-3 1000 MG CAPS Take 1,000 mg by mouth daily.     omeprazole (PRILOSEC) 40 MG capsule Take 40 mg by mouth daily.     sertraline (ZOLOFT) 25 MG tablet Take 25 mg by mouth as needed.     No current facility-administered medications for this visit.     PHYSICAL EXAMINATION: ECOG PERFORMANCE STATUS: 1 - Symptomatic but completely ambulatory Vitals:   08/10/23 0845  BP: 126/82  Pulse: 85  Resp: 18  Temp: (!) 96.6 F (35.9 C)  SpO2: (!) 85%   Filed Weights   08/10/23 0845  Weight: 169 lb 11.2 oz (77 kg)    Physical Exam Constitutional:      General: She is not in acute distress. HENT:     Head: Normocephalic and atraumatic.  Eyes:     General: No scleral icterus. Cardiovascular:     Rate and Rhythm: Normal rate and regular rhythm.     Heart sounds: Normal heart sounds.  Pulmonary:     Effort: Pulmonary effort is normal. No respiratory distress.  Abdominal:     General: Bowel sounds are normal. There is no distension.     Palpations: Abdomen is soft.  Musculoskeletal:        General: Normal range of motion.     Cervical back: Normal range of motion and neck supple.     Comments: Right knee s/p TKR,   Skin:    General: Skin is warm and dry.     Findings: No erythema or rash.  Neurological:     Mental Status:  She is alert and oriented to person, place, and time. Mental status is at baseline.  Psychiatric:        Mood and Affect: Mood normal.     LABORATORY DATA:  I have reviewed the data as listed    Latest Ref Rng & Units 08/03/2023    9:11 AM 01/10/2023    8:43 AM 12/22/2022    3:42 AM  CBC  WBC 4.0 - 10.5 K/uL 14.8  8.8  15.3   Hemoglobin 12.0 - 15.0 g/dL 54.0  98.1  19.1   Hematocrit 36.0 - 46.0 % 43.9  38.5  34.0   Platelets 150 - 400 K/uL 273  360  225       Latest Ref Rng & Units 08/03/2023    9:10 AM 01/10/2023    8:43 AM 12/22/2022    3:42 AM  CMP  Glucose 70 - 99 mg/dL 89  97  478   BUN 8 - 23 mg/dL 17  16  20    Creatinine 0.44 - 1.00 mg/dL 2.95  6.21  3.08   Sodium 135 - 145 mmol/L 138  138  139   Potassium 3.5 - 5.1 mmol/L 3.9  2.8  3.2   Chloride 98 - 111 mmol/L 102  96  104   CO2 22 - 32 mmol/L 25  27  22    Calcium 8.9 - 10.3 mg/dL 9.1  9.4  8.6   Total Protein 6.5 - 8.1 g/dL 7.0  7.7    Total Bilirubin <1.2 mg/dL 1.1  0.9    Alkaline  Phos 38 - 126 U/L 71  80    AST 15 - 41 U/L 20  23    ALT 0 - 44 U/L 33  17      RADIOGRAPHIC STUDIES: I have personally reviewed the radiological images as listed and agreed with the findings in the report. CT CHEST ABDOMEN PELVIS W CONTRAST Result Date: 08/03/2023 CLINICAL DATA:  History of colon cancer, restaging. * Tracking Code: BO * EXAM: CT CHEST, ABDOMEN, AND PELVIS WITH CONTRAST TECHNIQUE: Multidetector CT imaging of the chest, abdomen and pelvis was performed following the standard protocol during bolus administration of intravenous contrast. RADIATION DOSE REDUCTION: This exam was performed according to the departmental dose-optimization program which includes automated exposure control, adjustment of the mA and/or kV according to patient size and/or use of iterative reconstruction technique. CONTRAST:  OMNIPAQUE IOHEXOL 300 MG/ML  SOLN COMPARISON:  Multiple priors including most recent CT Jan 08, 2023 FINDINGS: CT CHEST  FINDINGS Cardiovascular: Aortic atherosclerosis. No central pulmonary embolus on this nondedicated study. Normal size heart. Scattered coronary artery calcifications. No significant pericardial effusion/thickening. Mediastinum/Nodes: No suspicious thyroid nodule. No pathologically enlarged mediastinal, hilar or axillary lymph nodes. The esophagus is grossly unremarkable. Lungs/Pleura: No suspicious pulmonary nodules or masses. Scattered atelectasis/scarring. No pleural effusion. No pneumothorax. Musculoskeletal: No aggressive lytic or blastic lesion of bone. Multilevel degenerative changes spine. CT ABDOMEN PELVIS FINDINGS Hepatobiliary: No suspicious hepatic lesion. Gallbladder is unremarkable. No biliary ductal dilation. Pancreas: No pancreatic ductal dilation or evidence of acute inflammation. Spleen: No splenomegaly. Adrenals/Urinary Tract: Bilateral adrenal glands appear normal. No hydronephrosis. Kidneys demonstrate symmetric enhancement. Urinary bladder is unremarkable for degree of distension. Stomach/Bowel: Radiopaque enteric contrast material traverses the descending colon. Stomach is unremarkable for degree of distension. No pathologic dilation of large or small bowel. No evidence of acute bowel inflammation. Moderate volume of formed stool in the colon. Prior right hemicolectomy with ileocolic anastomosis. No new suspicious nodularity along the suture line. Vascular/Lymphatic: Aortic atherosclerosis. Normal caliber abdominal aorta. Smooth IVC contours. The portal, splenic and superior mesenteric veins are patent. No pathologically enlarged abdominal or pelvic lymph nodes. Reproductive: Uterus and bilateral adnexa are unremarkable. Other: No significant abdominopelvic free fluid. Fat containing ventral hernia. Musculoskeletal: No aggressive lytic or blastic lesion of bone. IMPRESSION: 1. Prior right hemicolectomy with ileocolic anastomosis. No evidence of local recurrence or metastatic disease in the  chest, abdomen or pelvis. 2. Moderate volume of formed stool in the colon. 3.  Aortic Atherosclerosis (ICD10-I70.0). Electronically Signed   By: Maudry Mayhew M.D.   On: 08/03/2023 15:22   DG Chest 2 View Result Date: 07/20/2023 CLINICAL DATA:  Cough and wheezing. EXAM: CHEST - 2 VIEW COMPARISON:  04/17/2018.  CT, 01/08/2023. FINDINGS: Cardiac silhouette is normal in size and configuration. Normal mediastinal and hilar contours. Lungs are hyperexpanded, but clear. No pleural effusion or pneumothorax. Skeletal structures are intact. IMPRESSION: 1. No active cardiopulmonary disease. Electronically Signed   By: Amie Portland M.D.   On: 07/20/2023 15:07

## 2023-08-10 NOTE — Assessment & Plan Note (Signed)
Likely reactive, due to recent URI and steroid use.

## 2023-08-10 NOTE — Assessment & Plan Note (Signed)
Encourage her smoking cessation effort.

## 2023-08-27 ENCOUNTER — Other Ambulatory Visit: Payer: Self-pay | Admitting: Internal Medicine

## 2023-08-27 DIAGNOSIS — Z1231 Encounter for screening mammogram for malignant neoplasm of breast: Secondary | ICD-10-CM

## 2023-09-03 ENCOUNTER — Ambulatory Visit: Payer: Medicare HMO

## 2023-09-03 DIAGNOSIS — Z08 Encounter for follow-up examination after completed treatment for malignant neoplasm: Secondary | ICD-10-CM | POA: Diagnosis not present

## 2023-09-03 DIAGNOSIS — Z85038 Personal history of other malignant neoplasm of large intestine: Secondary | ICD-10-CM | POA: Diagnosis not present

## 2023-09-03 DIAGNOSIS — Z860101 Personal history of adenomatous and serrated colon polyps: Secondary | ICD-10-CM | POA: Diagnosis not present

## 2023-09-03 DIAGNOSIS — Z1211 Encounter for screening for malignant neoplasm of colon: Secondary | ICD-10-CM | POA: Diagnosis not present

## 2023-09-03 DIAGNOSIS — K640 First degree hemorrhoids: Secondary | ICD-10-CM | POA: Diagnosis not present

## 2023-09-03 DIAGNOSIS — D125 Benign neoplasm of sigmoid colon: Secondary | ICD-10-CM | POA: Diagnosis not present

## 2023-09-03 DIAGNOSIS — Z980 Intestinal bypass and anastomosis status: Secondary | ICD-10-CM | POA: Diagnosis not present

## 2023-10-22 DIAGNOSIS — D2261 Melanocytic nevi of right upper limb, including shoulder: Secondary | ICD-10-CM | POA: Diagnosis not present

## 2023-10-22 DIAGNOSIS — Z872 Personal history of diseases of the skin and subcutaneous tissue: Secondary | ICD-10-CM | POA: Diagnosis not present

## 2023-10-22 DIAGNOSIS — D2272 Melanocytic nevi of left lower limb, including hip: Secondary | ICD-10-CM | POA: Diagnosis not present

## 2023-10-22 DIAGNOSIS — D225 Melanocytic nevi of trunk: Secondary | ICD-10-CM | POA: Diagnosis not present

## 2023-10-22 DIAGNOSIS — D2262 Melanocytic nevi of left upper limb, including shoulder: Secondary | ICD-10-CM | POA: Diagnosis not present

## 2023-10-22 DIAGNOSIS — D2271 Melanocytic nevi of right lower limb, including hip: Secondary | ICD-10-CM | POA: Diagnosis not present

## 2023-11-19 DIAGNOSIS — R0789 Other chest pain: Secondary | ICD-10-CM | POA: Diagnosis not present

## 2023-11-19 DIAGNOSIS — J209 Acute bronchitis, unspecified: Secondary | ICD-10-CM | POA: Diagnosis not present

## 2023-11-19 DIAGNOSIS — R030 Elevated blood-pressure reading, without diagnosis of hypertension: Secondary | ICD-10-CM | POA: Diagnosis not present

## 2023-11-19 DIAGNOSIS — Z6830 Body mass index (BMI) 30.0-30.9, adult: Secondary | ICD-10-CM | POA: Diagnosis not present

## 2023-11-19 DIAGNOSIS — J0140 Acute pansinusitis, unspecified: Secondary | ICD-10-CM | POA: Diagnosis not present

## 2023-11-20 ENCOUNTER — Encounter

## 2023-11-21 DIAGNOSIS — H524 Presbyopia: Secondary | ICD-10-CM | POA: Diagnosis not present

## 2023-11-26 ENCOUNTER — Encounter

## 2023-11-28 ENCOUNTER — Ambulatory Visit
Admission: RE | Admit: 2023-11-28 | Discharge: 2023-11-28 | Disposition: A | Discharge status: Home / Self Care | Source: Ambulatory Visit | Attending: Internal Medicine | Admitting: Internal Medicine

## 2023-11-28 DIAGNOSIS — Z1231 Encounter for screening mammogram for malignant neoplasm of breast: Secondary | ICD-10-CM | POA: Diagnosis not present

## 2023-12-25 DIAGNOSIS — Z96651 Presence of right artificial knee joint: Secondary | ICD-10-CM | POA: Diagnosis not present

## 2024-01-21 DIAGNOSIS — E7849 Other hyperlipidemia: Secondary | ICD-10-CM | POA: Diagnosis not present

## 2024-01-21 DIAGNOSIS — D72829 Elevated white blood cell count, unspecified: Secondary | ICD-10-CM | POA: Diagnosis not present

## 2024-01-21 DIAGNOSIS — I700 Atherosclerosis of aorta: Secondary | ICD-10-CM | POA: Diagnosis not present

## 2024-01-21 DIAGNOSIS — R7303 Prediabetes: Secondary | ICD-10-CM | POA: Diagnosis not present

## 2024-01-25 DIAGNOSIS — I700 Atherosclerosis of aorta: Secondary | ICD-10-CM | POA: Diagnosis not present

## 2024-01-25 DIAGNOSIS — E781 Pure hyperglyceridemia: Secondary | ICD-10-CM | POA: Diagnosis not present

## 2024-01-25 DIAGNOSIS — R7303 Prediabetes: Secondary | ICD-10-CM | POA: Diagnosis not present

## 2024-02-08 ENCOUNTER — Ambulatory Visit
Admission: RE | Admit: 2024-02-08 | Discharge: 2024-02-08 | Disposition: A | Discharge status: Home / Self Care | Payer: Medicare HMO | Source: Ambulatory Visit | Attending: Oncology | Admitting: Oncology

## 2024-02-08 ENCOUNTER — Inpatient Hospital Stay: Payer: Medicare HMO | Attending: Oncology

## 2024-02-08 ENCOUNTER — Other Ambulatory Visit: Payer: Medicare HMO

## 2024-02-08 DIAGNOSIS — C182 Malignant neoplasm of ascending colon: Secondary | ICD-10-CM | POA: Diagnosis not present

## 2024-02-08 DIAGNOSIS — K760 Fatty (change of) liver, not elsewhere classified: Secondary | ICD-10-CM | POA: Diagnosis not present

## 2024-02-08 DIAGNOSIS — Z85038 Personal history of other malignant neoplasm of large intestine: Secondary | ICD-10-CM | POA: Insufficient documentation

## 2024-02-08 DIAGNOSIS — Z87891 Personal history of nicotine dependence: Secondary | ICD-10-CM | POA: Insufficient documentation

## 2024-02-08 DIAGNOSIS — I700 Atherosclerosis of aorta: Secondary | ICD-10-CM | POA: Diagnosis not present

## 2024-02-08 LAB — CMP (CANCER CENTER ONLY)
ALT: 26 U/L (ref 0–44)
AST: 27 U/L (ref 15–41)
Albumin: 4.4 g/dL (ref 3.5–5.0)
Alkaline Phosphatase: 76 U/L (ref 38–126)
Anion gap: 11 (ref 5–15)
BUN: 19 mg/dL (ref 8–23)
CO2: 21 mmol/L — ABNORMAL LOW (ref 22–32)
Calcium: 9.6 mg/dL (ref 8.9–10.3)
Chloride: 106 mmol/L (ref 98–111)
Creatinine: 0.88 mg/dL (ref 0.44–1.00)
GFR, Estimated: >60 mL/min (ref >60)
Glucose, Bld: 153 mg/dL — ABNORMAL HIGH (ref 70–99)
Potassium: 3.8 mmol/L (ref 3.5–5.1)
Sodium: 138 mmol/L (ref 135–145)
Total Bilirubin: 1 mg/dL (ref 0.0–1.2)
Total Protein: 7.6 g/dL (ref 6.5–8.1)

## 2024-02-08 LAB — CBC WITH DIFFERENTIAL (CANCER CENTER ONLY)
Abs Immature Granulocytes: 0.02 10*3/uL (ref 0.00–0.07)
Basophils Absolute: 0.1 10*3/uL (ref 0.0–0.1)
Basophils Relative: 1 %
Eosinophils Absolute: 0.2 10*3/uL (ref 0.0–0.5)
Eosinophils Relative: 2 %
HCT: 40.3 % (ref 36.0–46.0)
Hemoglobin: 13.7 g/dL (ref 12.0–15.0)
Immature Granulocytes: 0 %
Lymphocytes Relative: 31 %
Lymphs Abs: 2.4 10*3/uL (ref 0.7–4.0)
MCH: 31.3 pg (ref 26.0–34.0)
MCHC: 34 g/dL (ref 30.0–36.0)
MCV: 92 fL (ref 80.0–100.0)
Monocytes Absolute: 0.5 10*3/uL (ref 0.1–1.0)
Monocytes Relative: 7 %
Neutro Abs: 4.6 10*3/uL (ref 1.7–7.7)
Neutrophils Relative %: 59 %
Platelet Count: 247 10*3/uL (ref 150–400)
RBC: 4.38 MIL/uL (ref 3.87–5.11)
RDW: 12.6 % (ref 11.5–15.5)
WBC Count: 7.7 10*3/uL (ref 4.0–10.5)
nRBC: 0 % (ref 0.0–0.2)

## 2024-02-08 MED ORDER — IOHEXOL 300 MG/ML  SOLN
100.0000 mL | Freq: Once | INTRAMUSCULAR | Status: AC | PRN
  Administered 2024-02-08: 100 mL via INTRAVENOUS

## 2024-02-09 LAB — CEA: CEA: 2.8 ng/mL (ref 0.0–4.7)

## 2024-02-15 ENCOUNTER — Inpatient Hospital Stay (HOSPITAL_BASED_OUTPATIENT_CLINIC_OR_DEPARTMENT_OTHER): Payer: Medicare HMO | Admitting: Oncology

## 2024-02-15 ENCOUNTER — Ambulatory Visit: Payer: Medicare HMO | Admitting: Oncology

## 2024-02-15 ENCOUNTER — Encounter: Payer: Self-pay | Admitting: Oncology

## 2024-02-15 VITALS — BP 125/84 | HR 95 | Temp 98.3°F | Resp 18 | Wt 184.8 lb

## 2024-02-15 DIAGNOSIS — K760 Fatty (change of) liver, not elsewhere classified: Secondary | ICD-10-CM | POA: Diagnosis not present

## 2024-02-15 DIAGNOSIS — Z85038 Personal history of other malignant neoplasm of large intestine: Secondary | ICD-10-CM | POA: Diagnosis not present

## 2024-02-15 DIAGNOSIS — C182 Malignant neoplasm of ascending colon: Secondary | ICD-10-CM | POA: Diagnosis not present

## 2024-02-15 DIAGNOSIS — Z87891 Personal history of nicotine dependence: Secondary | ICD-10-CM

## 2024-02-15 NOTE — Assessment & Plan Note (Signed)
 Recommend healthy diet and exercise.

## 2024-02-15 NOTE — Assessment & Plan Note (Addendum)
#  Ascending colon carcinoma with mucinous feature, pT3 pN0 cMx, Stage II disease, no high risk features,  Labs reviewed and discussed with patient. CEA normalized, likely due to smoke cessation.  Patient is doing very well clinically. She is close to 3 year after surgery.  June 2025 CT scan shows no signs of cancer recurrence. Continue surveillance. Follow up H&P labs in 6 months,  Repeat CT in 12 months

## 2024-02-15 NOTE — Progress Notes (Signed)
 Hematology/Oncology Progress note [phone] [phone]       Patient Care Team: [name], MD as PCP - General (Internal Medicine) [name], RN as Oncology Nurse Navigator [name], MD as Consulting Physician (Oncology)  ASSESSMENT & PLAN:   Cancer Staging  Malignant neoplasm of ascending colon [facility]) Staging form: Colon and Rectum, AJCC 8th Edition - Pathologic stage from 03/02/2021: Stage IIA (pT3, pN0, cM0) - Signed by [name], MD on 03/02/2021    Malignant neoplasm of ascending colon (HCC) #Ascending colon carcinoma with mucinous feature, pT3 pN0 cMx, Stage II disease, no high risk features,  Labs reviewed and discussed with patient. CEA normalized, likely due to smoke cessation.  Patient is doing very well clinically. She is close to 3 year after surgery.  June 2025 CT scan shows no signs of cancer recurrence. Continue surveillance. Follow up H&P labs in 6 months,  Repeat CT in 12 months   Fatty liver disease, nonalcoholic Recommend healthy diet and exercise.   Former smoker Encourage her smoking cessation effort.  She gets CT scan for surveillance of colon cancer    Orders Placed This Encounter  Procedures   CBC with Differential (Cancer Center Only)    Standing Status:   Future    Expiration Date:   02/14/2025   CEA    Standing Status:   Future    Expiration Date:   02/14/2025   CMP (Cancer Center only)    Standing Status:   Future    Expiration Date:   02/14/2025   Follow-up in 6 months All questions were answered. The patient knows to call the clinic with any problems, questions or concerns.  Call Babara, MD, PhD [facility] Hematology Oncology 02/15/2024   CHIEF COMPLAINTS/REASON FOR VISIT:  right colon cancer follow up  HISTORY OF PRESENTING ILLNESS:   Katie Liu is a  73 y.o.  female presents for follow up of Stage II right colon cancer Oncology History  Malignant neoplasm of ascending colon (HCC)   12/30/2020 Imaging   CT abdomen/pelvis angiogram was obtained for evaluation of postprandial abdominal pain. There was no significant narrowing of the mesenteric arteries or veins.  Masslike thickening of the wall of the cecum spacious for malignancy   01/11/2021 Initial Diagnosis   Malignant neoplasm of ascending colon  -12/21/2020, patient was referred to see [facility] gastroenterology Dr. Aubery Rocks for evaluation of generalized postprandial abdominal pain and unintentional weight loss.  Symptom onset is 2 to 3 months, some nausea without emesis.  20 pounds of unintentional weight loss within the past few months.  Early satiety, only eating one third of her plate.   -4/83/7977, EGD showed esophageal mucosal.  1cm hiatal hernia Colonoscopy findings includes 7 mm polyp in the rectum, resected and retrieved, nonbleeding internal -pathology showed tubular adenoma negative for high-grade dysplasia and malignancy.  Hemorrhoids. Malignant partially obstructing tumor in the proximal ascending colon.  Tattooed and biopsied. -Proximal ascending colon mass biopsy pathology showed invasive moderately differentiated adenocarcinoma   02/24/2021 Surgery   patient underwent right hemicolectomy. Invasive adenocarcinoma with mucinous features 8cm, grade 2, negative LVI or perineural invasion. carcinoma extends into pericolonic connective tissue. Margin is negative. 0/21 lymph nodes positive.  pT3 pN0, stage II      03/02/2021 Cancer Staging   Staging form: Colon and Rectum, AJCC 8th Edition - Pathologic stage from 03/02/2021: Stage IIA (pT3, pN0, cM0) - Signed by [name], MD on 03/02/2021 Stage prefix: Initial diagnosis Total  positive nodes: 0   12/05/2021 Imaging   CT chest abdomen pelvis with contrast showed no evidence of recurrence or metastatic disease in the chest, abdomen or pelvis.    07/10/2022 Imaging   CT chest abdomen pelvis with contrast 1. Prior right hemicolectomy with ileocolic  anastomosis without evidence of local recurrence. 2. No evidence of metastatic disease within the chest, abdomen, or pelvis. 3. Moderate volume of formed stool throughout the colon suggestive of constipation. 4.  Aortic Atherosclerosis .   01/08/2023 Imaging   CT chest abdomen pelvis w contrast  1. Status post right hemicolectomy and reanastomosis. 2. No evidence of recurrent or metastatic disease in the chest,abdomen, or pelvis. 3. Background of fine centrilobular nodularity, most concentrated in the lung apices, most commonly seen in smoking-related respiratorybronchiolitis. 4. Hepatic steatosis      INTERVAL HISTORY Katie Liu is a 73 y.o. female who has above history reviewed by me today presents for follow up visit for management of stage II right colon cancer She has good appetite and has gained weight.  No new concerns. .     Review of Systems  Constitutional:  Negative for appetite change, chills, fatigue, fever and unexpected weight change.  HENT:   Negative for hearing loss and voice change.   Eyes:  Negative for eye problems.  Respiratory:  Negative for chest tightness and cough.   Cardiovascular:  Negative for chest pain.  Gastrointestinal:  Negative for abdominal distention, abdominal pain and blood in stool.  Endocrine: Negative for hot flashes.  Genitourinary:  Negative for difficulty urinating and frequency.   Musculoskeletal:  Negative for arthralgias.       Right knee TKR  Skin:  Negative for itching and rash.  Neurological:  Negative for extremity weakness.  Hematological:  Negative for adenopathy.  Psychiatric/Behavioral:  Negative for confusion.     MEDICAL HISTORY:  Past Medical History:  Diagnosis Date   Allergy    Anemia    Anxiety    Arthritis    Cancer (HCC)    colon   Complication of anesthesia    started to feel when having her cataract surgery   GERD (gastroesophageal reflux disease)    Hyperlipemia    Hypertension    Pre-diabetes     Vitamin D deficiency     SURGICAL HISTORY: Past Surgical History:  Procedure Laterality Date   ACHILLES TENDON REPAIR Bilateral    CESAREAN SECTION     COLON SURGERY     COLONOSCOPY WITH PROPOFOL  N/A 01/03/2021   Procedure: COLONOSCOPY WITH PROPOFOL ;  Surgeon: Toledo, Ladell POUR, MD;  Location: [facility] ENDOSCOPY;  Service: Gastroenterology;  Laterality: N/A;   ESOPHAGOGASTRODUODENOSCOPY (EGD) WITH PROPOFOL  N/A 01/03/2021   Procedure: ESOPHAGOGASTRODUODENOSCOPY (EGD) WITH PROPOFOL ;  Surgeon: Toledo, Ladell POUR, MD;  Location: [facility] ENDOSCOPY;  Service: Gastroenterology;  Laterality: N/A;   EYE SURGERY     bilateral cataract   KNEE ARTHROSCOPY W/ ACL RECONSTRUCTION     LAPAROSCOPIC RIGHT HEMI COLECTOMY N/A 02/24/2021   Procedure: LAPAROSCOPIC RIGHT HEMI COLECTOMY;  Surgeon: Teresa Lonni CHRISTELLA, MD;  Location: [facility] ORS;  Service: General;  Laterality: N/A;   TOTAL KNEE ARTHROPLASTY Right 12/21/2022   Procedure: TOTAL KNEE ARTHROPLASTY;  Surgeon: Lorelle Hussar, MD;  Location: [facility] ORS;  Service: Orthopedics;  Laterality: Right;  RNFA    SOCIAL HISTORY: Social History   Socioeconomic History   Marital status: Married    Spouse name: Jeralyn   Number of children: Not on file   Years of education:  Not on file   Highest education level: Not on file  Occupational History   Not on file  Tobacco Use   Smoking status: Former    Current packs/day: 0.00    Average packs/day: 0.3 packs/day for 10.0 years (2.5 ttl pk-yrs)    Types: Cigarettes    Start date: 01/26/2012    Quit date: 06/22/2023    Years since quitting: 0.6   Smokeless tobacco: Never  Vaping Use   Vaping status: Never Used  Substance and Sexual Activity   Alcohol  use: Yes    Alcohol /week: 2.0 standard drinks of alcohol     Types: 2 Glasses of wine per week    Comment: occasional glass of wine.   Drug use: Never   Sexual activity: Not on file  Other Topics Concern   Not on file  Social History Narrative   Not on file    Social Drivers of Health   Financial Resource Strain: Low Risk  (05/10/2023)   Received from [facility]   Overall Financial Resource Strain (CARDIA)    Difficulty of Paying Living Expenses: Not hard at all  Food Insecurity: No Food Insecurity (05/10/2023)   Received from [facility]   Hunger Vital Sign    Within the past 12 months, you worried that your food would run out before you got the money to buy more.: Never true    Within the past 12 months, the food you bought just didn't last and you didn't have money to get more.: Never true  Transportation Needs: No Transportation Needs (05/10/2023)   Received from [facility] - Transportation    In the past 12 months, has lack of transportation kept you from medical appointments or from getting medications?: No    Lack of Transportation (Non-Medical): No  Physical Activity: Not on file  Stress: Not on file  Social Connections: Not on file  Intimate Partner Violence: Not At Risk (12/21/2022)   Humiliation, Afraid, Rape, and Kick questionnaire    Fear of Current or Ex-Partner: No    Emotionally Abused: No    Physically Abused: No    Sexually Abused: No    FAMILY HISTORY: Family History  Problem Relation Age of Onset   Cancer Neg Hx    Breast cancer Neg Hx     ALLERGIES:  has no known allergies.  MEDICATIONS:  Current Outpatient Medications  Medication Sig Dispense Refill   aspirin  81 MG chewable tablet Chew 81 mg by mouth daily.     atorvastatin  (LIPITOR) 40 MG tablet Take 40 mg by mouth daily.     cetirizine (ZYRTEC) 10 MG tablet Take 10 mg by mouth daily as needed for allergies.     Cholecalciferol  (VITAMIN D3) 25 MCG (1000 UT) CAPS Take 2,000 Units by mouth at bedtime.     cyanocobalamin  (VITAMIN B12) 1000 MCG tablet Take 500 mcg by mouth daily.     fluticasone (FLONASE) 50 MCG/ACT nasal spray Place 2 sprays into both nostrils daily.     Multiple Vitamin  (MULTIVITAMIN) capsule Take 1 capsule by mouth daily.     Omega-3 1000 MG CAPS Take 1,000 mg by mouth daily.     omeprazole (PRILOSEC) 40 MG capsule Take 40 mg by mouth daily.     sertraline  (ZOLOFT ) 25 MG tablet Take 25 mg by mouth as needed.     acetaminophen  (TYLENOL ) 500 MG tablet Take 2 tablets (1,000 mg total) by mouth every 8 (eight)  hours. 30 tablet 0   No current facility-administered medications for this visit.     PHYSICAL EXAMINATION: ECOG PERFORMANCE STATUS: 1 - Symptomatic but completely ambulatory Vitals:   02/15/24 0852  BP: 125/84  Pulse: 95  Resp: 18  Temp: 98.3 F (36.8 C)  SpO2: 97%   Filed Weights   02/15/24 0852  Weight: 184 lb 12.8 oz (83.8 kg)    Physical Exam Constitutional:      General: She is not in acute distress. HENT:     Head: Normocephalic and atraumatic.   Eyes:     General: No scleral icterus.   Cardiovascular:     Rate and Rhythm: Normal rate and regular rhythm.     Heart sounds: Normal heart sounds.  Pulmonary:     Effort: Pulmonary effort is normal. No respiratory distress.  Abdominal:     General: Bowel sounds are normal. There is no distension.     Palpations: Abdomen is soft.   Musculoskeletal:        General: Normal range of motion.     Cervical back: Normal range of motion and neck supple.     Comments: Right knee s/p TKR,    Skin:    General: Skin is warm and dry.     Findings: No erythema or rash.   Neurological:     Mental Status: She is alert and oriented to person, place, and time. Mental status is at baseline.   Psychiatric:        Mood and Affect: Mood normal.     LABORATORY DATA:  I have reviewed the data as listed    Latest Ref Rng & Units 02/08/2024    8:14 AM 08/03/2023    9:11 AM 01/10/2023    8:43 AM  CBC  WBC 4.0 - 10.5 K/uL 7.7  14.8  8.8   Hemoglobin 12.0 - 15.0 g/dL 86.2  85.2  86.3   Hematocrit 36.0 - 46.0 % 40.3  43.9  38.5   Platelets 150 - 400 K/uL 247  273  360       Latest Ref  Rng & Units 02/08/2024    8:14 AM 08/03/2023    9:10 AM 01/10/2023    8:43 AM  CMP  Glucose 70 - 99 mg/dL 846  89  97   BUN 8 - 23 mg/dL 19  17  16    Creatinine 0.44 - 1.00 mg/dL 9.11  9.16  9.18   Sodium 135 - 145 mmol/L 138  138  138   Potassium 3.5 - 5.1 mmol/L 3.8  3.9  2.8   Chloride 98 - 111 mmol/L 106  102  96   CO2 22 - 32 mmol/L 21  25  27    Calcium  8.9 - 10.3 mg/dL 9.6  9.1  9.4   Total Protein 6.5 - 8.1 g/dL 7.6  7.0  7.7   Total Bilirubin 0.0 - 1.2 mg/dL 1.0  1.1  0.9   Alkaline Phos 38 - 126 U/L 76  71  80   AST 15 - 41 U/L 27  20  23    ALT 0 - 44 U/L 26  33  17     RADIOGRAPHIC STUDIES: I have personally reviewed the radiological images as listed and agreed with the findings in the report. CT CHEST ABDOMEN PELVIS W CONTRAST Result Date: 02/08/2024 CLINICAL DATA:  Malignant neoplasm of ascending colon. Restaging. * Tracking Code: BO *. EXAM: CT CHEST, ABDOMEN, AND PELVIS WITH CONTRAST TECHNIQUE: Multidetector CT  imaging of the chest, abdomen and pelvis was performed following the standard protocol during bolus administration of intravenous contrast. RADIATION DOSE REDUCTION: This exam was performed according to the departmental dose-optimization program which includes automated exposure control, adjustment of the mA and/or kV according to patient size and/or use of iterative reconstruction technique. CONTRAST:  OMNIPAQUE  IOHEXOL  300 MG/ML  SOLN COMPARISON:  Multiple priors including most recent CT August 03, 2023 FINDINGS: CT CHEST FINDINGS Cardiovascular: Aortic atherosclerosis. Normal size heart. Scattered coronary artery calcifications. Mediastinum/Nodes: No suspicious thyroid  nodule. No pathologically enlarged mediastinal, hilar or axillary lymph nodes. The esophagus is grossly unremarkable. Lungs/Pleura: Lungs are clear. No pleural effusion or pneumothorax. Musculoskeletal: No chest wall mass or suspicious bone lesions identified. CT ABDOMEN PELVIS FINDINGS  Hepatobiliary: Diffuse hepatic steatosis. No suspicious hepatic lesion. Gallbladder is unremarkable. No biliary ductal dilation. Pancreas: No pancreatic ductal dilation or evidence of acute inflammation. Spleen: No splenomegaly. Adrenals/Urinary Tract: Bilateral adrenal glands appear normal. No hydronephrosis. Kidneys demonstrate symmetric enhancement. Urinary bladder is unremarkable for degree of distension. Stomach/Bowel: Stomach is unremarkable for degree of distension. Radiopaque enteric contrast material traverses the rectum. No pathologic dilation of small or large bowel. Prior right hemicolectomy with ileocolic anastomosis in the right hemiabdomen. No suspicious nodularity along the suture line. Vascular/Lymphatic: Aortic atherosclerosis. The portal, splenic and superior mesenteric veins are patent. No pathologically enlarged abdominal or pelvic lymph nodes. Reproductive: Uterus and bilateral adnexa are unremarkable. Other: No significant abdominopelvic free fluid. Fat containing ventral hernia. Postsurgical change in the anterior abdominal wall. Musculoskeletal: No aggressive lytic or blastic lesion of bone. IMPRESSION: 1. Prior right hemicolectomy without evidence of local recurrence or metastatic disease within the chest, abdomen or pelvis. 2. Diffuse hepatic steatosis. 3.  Aortic Atherosclerosis (ICD10-I70.0). Electronically Signed   By: Reyes Holder M.D.   On: 02/08/2024 12:16

## 2024-02-15 NOTE — Assessment & Plan Note (Signed)
 Encourage her smoking cessation effort.  She gets CT scan for surveillance of colon cancer

## 2024-03-12 DIAGNOSIS — M79645 Pain in left finger(s): Secondary | ICD-10-CM | POA: Diagnosis not present

## 2024-03-12 DIAGNOSIS — M7582 Other shoulder lesions, left shoulder: Secondary | ICD-10-CM | POA: Diagnosis not present

## 2024-03-12 DIAGNOSIS — M7581 Other shoulder lesions, right shoulder: Secondary | ICD-10-CM | POA: Diagnosis not present

## 2024-03-12 DIAGNOSIS — M180 Bilateral primary osteoarthritis of first carpometacarpal joints: Secondary | ICD-10-CM | POA: Diagnosis not present

## 2024-03-12 DIAGNOSIS — M79644 Pain in right finger(s): Secondary | ICD-10-CM | POA: Diagnosis not present

## 2024-05-02 DIAGNOSIS — U071 COVID-19: Secondary | ICD-10-CM | POA: Diagnosis not present

## 2024-05-02 DIAGNOSIS — R0981 Nasal congestion: Secondary | ICD-10-CM | POA: Diagnosis not present

## 2024-05-02 DIAGNOSIS — R051 Acute cough: Secondary | ICD-10-CM | POA: Diagnosis not present

## 2024-05-08 DIAGNOSIS — J329 Chronic sinusitis, unspecified: Secondary | ICD-10-CM | POA: Diagnosis not present

## 2024-05-08 DIAGNOSIS — R051 Acute cough: Secondary | ICD-10-CM | POA: Diagnosis not present

## 2024-05-08 DIAGNOSIS — B9689 Other specified bacterial agents as the cause of diseases classified elsewhere: Secondary | ICD-10-CM | POA: Diagnosis not present

## 2024-05-08 DIAGNOSIS — R062 Wheezing: Secondary | ICD-10-CM | POA: Diagnosis not present

## 2024-07-25 ENCOUNTER — Telehealth: Payer: Self-pay | Admitting: Oncology

## 2024-07-25 NOTE — Telephone Encounter (Signed)
 Pt was scheduled for labs 12/19 and MD 08/22/2024. MD will not be here on 1/2. I called and spoke with pt and have r/s both the lab and the MD appt so the labs will still be accurate and close to the new MD appt. Both appts have been confirmed with pt. She declined mailing AVS and will check her mychart.

## 2024-08-08 ENCOUNTER — Other Ambulatory Visit

## 2024-08-22 ENCOUNTER — Ambulatory Visit: Admitting: Oncology

## 2024-09-03 ENCOUNTER — Inpatient Hospital Stay: Attending: Oncology

## 2024-09-03 DIAGNOSIS — Z85038 Personal history of other malignant neoplasm of large intestine: Secondary | ICD-10-CM | POA: Insufficient documentation

## 2024-09-03 DIAGNOSIS — Z87891 Personal history of nicotine dependence: Secondary | ICD-10-CM | POA: Insufficient documentation

## 2024-09-03 DIAGNOSIS — C182 Malignant neoplasm of ascending colon: Secondary | ICD-10-CM

## 2024-09-03 LAB — CBC WITH DIFFERENTIAL (CANCER CENTER ONLY)
Abs Immature Granulocytes: 0.02 K/uL (ref 0.00–0.07)
Basophils Absolute: 0.1 K/uL (ref 0.0–0.1)
Basophils Relative: 1 %
Eosinophils Absolute: 0.1 K/uL (ref 0.0–0.5)
Eosinophils Relative: 2 %
HCT: 42.1 % (ref 36.0–46.0)
Hemoglobin: 14.2 g/dL (ref 12.0–15.0)
Immature Granulocytes: 0 %
Lymphocytes Relative: 30 %
Lymphs Abs: 2.2 K/uL (ref 0.7–4.0)
MCH: 31.6 pg (ref 26.0–34.0)
MCHC: 33.7 g/dL (ref 30.0–36.0)
MCV: 93.8 fL (ref 80.0–100.0)
Monocytes Absolute: 0.7 K/uL (ref 0.1–1.0)
Monocytes Relative: 9 %
Neutro Abs: 4.3 K/uL (ref 1.7–7.7)
Neutrophils Relative %: 58 %
Platelet Count: 247 K/uL (ref 150–400)
RBC: 4.49 MIL/uL (ref 3.87–5.11)
RDW: 12.1 % (ref 11.5–15.5)
WBC Count: 7.3 K/uL (ref 4.0–10.5)
nRBC: 0 % (ref 0.0–0.2)

## 2024-09-03 LAB — CMP (CANCER CENTER ONLY)
ALT: 38 U/L (ref 0–44)
AST: 37 U/L (ref 15–41)
Albumin: 4.8 g/dL (ref 3.5–5.0)
Alkaline Phosphatase: 82 U/L (ref 38–126)
Anion gap: 14 (ref 5–15)
BUN: 17 mg/dL (ref 8–23)
CO2: 21 mmol/L — ABNORMAL LOW (ref 22–32)
Calcium: 10.1 mg/dL (ref 8.9–10.3)
Chloride: 106 mmol/L (ref 98–111)
Creatinine: 0.85 mg/dL (ref 0.44–1.00)
GFR, Estimated: >60 mL/min
Glucose, Bld: 127 mg/dL — ABNORMAL HIGH (ref 70–99)
Potassium: 4.5 mmol/L (ref 3.5–5.1)
Sodium: 141 mmol/L (ref 135–145)
Total Bilirubin: 0.7 mg/dL (ref 0.0–1.2)
Total Protein: 7.7 g/dL (ref 6.5–8.1)

## 2024-09-04 LAB — CEA: CEA: 3.5 ng/mL (ref 0.0–4.7)

## 2024-09-09 ENCOUNTER — Encounter: Payer: Self-pay | Admitting: Oncology

## 2024-09-09 ENCOUNTER — Inpatient Hospital Stay: Admitting: Oncology

## 2024-09-09 VITALS — BP 119/87 | HR 110 | Temp 97.7°F | Resp 18 | Wt 190.3 lb

## 2024-09-09 DIAGNOSIS — K760 Fatty (change of) liver, not elsewhere classified: Secondary | ICD-10-CM

## 2024-09-09 DIAGNOSIS — C182 Malignant neoplasm of ascending colon: Secondary | ICD-10-CM

## 2024-09-09 DIAGNOSIS — Z87891 Personal history of nicotine dependence: Secondary | ICD-10-CM

## 2024-09-09 DIAGNOSIS — Z85038 Personal history of other malignant neoplasm of large intestine: Secondary | ICD-10-CM | POA: Diagnosis not present

## 2024-09-09 NOTE — Progress Notes (Signed)
 " Hematology/Oncology Progress note [phone] [phone]       Patient Care Team: [name], MD as PCP - General (Internal Medicine) [name], RN as Oncology Nurse Navigator [name], MD as Consulting Physician (Oncology)  ASSESSMENT & PLAN:   Cancer Staging  Malignant neoplasm of ascending colon [facility]) Staging form: Colon and Rectum, AJCC 8th Edition - Pathologic stage from 03/02/2021: Stage IIA (pT3, pN0, cM0) - Signed by [name], MD on 03/02/2021    Malignant neoplasm of ascending colon (HCC) #Ascending colon carcinoma with mucinous feature, pT3 pN0 cMx, Stage II disease, no high risk features,  Labs reviewed and discussed with patient. CEA normalized, likely due to smoke cessation.  Patient is doing very well clinically. She is close to 3 year after surgery.  June 2025 CT scan shows no signs of cancer recurrence. Continue surveillance. Follow up H&P labs in 6 months,  Repeat CT in 6 months   Fatty liver disease, nonalcoholic Recommend healthy diet and exercise.   Former smoker Encourage her smoking cessation effort.  She gets CT scan for surveillance of colon cancer    Orders Placed This Encounter  Procedures   CT CHEST ABDOMEN PELVIS W CONTRAST    Standing Status:   Future    Expected Date:   03/09/2025    Expiration Date:   06/07/2025    If indicated for the ordered procedure, I authorize the administration of contrast media per Radiology protocol:   Yes    Does the patient have a contrast media/X-ray dye allergy?:   No    Preferred imaging location?:   [facility]    If indicated for the ordered procedure, I authorize the administration of oral contrast media per Radiology protocol:   Yes   CBC with Differential (Cancer Center Only)    Standing Status:   Future    Expected Date:   03/09/2025    Expiration Date:   06/07/2025   CEA    Standing Status:   Future    Expected Date:   03/09/2025    Expiration Date:    06/07/2025   CMP (Cancer Center only)    Standing Status:   Future    Expected Date:   03/09/2025    Expiration Date:   06/07/2025   Follow-up in 6 months All questions were answered. The patient knows to call the clinic with any problems, questions or concerns.  Call Babara, MD, PhD [facility] Hematology Oncology 09/09/2024   CHIEF COMPLAINTS/REASON FOR VISIT:  right colon cancer follow up  HISTORY OF PRESENTING ILLNESS:   Katie Liu is a  74 y.o.  female presents for follow up of Stage II right colon cancer Oncology History  Malignant neoplasm of ascending colon (HCC)  12/30/2020 Imaging   CT abdomen/pelvis angiogram was obtained for evaluation of postprandial abdominal pain. There was no significant narrowing of the mesenteric arteries or veins.  Masslike thickening of the wall of the cecum spacious for malignancy   01/11/2021 Initial Diagnosis   Malignant neoplasm of ascending colon  -12/21/2020, patient was referred to see [facility] gastroenterology Dr. Aubery Rocks for evaluation of generalized postprandial abdominal pain and unintentional weight loss.  Symptom onset is 2 to 3 months, some nausea without emesis.  20 pounds of unintentional weight loss within the past few months.  Early satiety, only eating one third of her plate.   -4/83/7977, EGD showed esophageal mucosal.  1cm hiatal hernia Colonoscopy findings includes 7 mm  polyp in the rectum, resected and retrieved, nonbleeding internal -pathology showed tubular adenoma negative for high-grade dysplasia and malignancy.  Hemorrhoids. Malignant partially obstructing tumor in the proximal ascending colon.  Tattooed and biopsied. -Proximal ascending colon mass biopsy pathology showed invasive moderately differentiated adenocarcinoma   02/24/2021 Surgery   patient underwent right hemicolectomy. Invasive adenocarcinoma with mucinous features 8cm, grade 2, negative LVI or perineural invasion. carcinoma extends into  pericolonic connective tissue. Margin is negative. 0/21 lymph nodes positive.  pT3 pN0, stage II      03/02/2021 Cancer Staging   Staging form: Colon and Rectum, AJCC 8th Edition - Pathologic stage from 03/02/2021: Stage IIA (pT3, pN0, cM0) - Signed by [name], MD on 03/02/2021 Stage prefix: Initial diagnosis Total positive nodes: 0   12/05/2021 Imaging   CT chest abdomen pelvis with contrast showed no evidence of recurrence or metastatic disease in the chest, abdomen or pelvis.    07/10/2022 Imaging   CT chest abdomen pelvis with contrast 1. Prior right hemicolectomy with ileocolic anastomosis without evidence of local recurrence. 2. No evidence of metastatic disease within the chest, abdomen, or pelvis. 3. Moderate volume of formed stool throughout the colon suggestive of constipation. 4.  Aortic Atherosclerosis .   01/08/2023 Imaging   CT chest abdomen pelvis w contrast  1. Status post right hemicolectomy and reanastomosis. 2. No evidence of recurrent or metastatic disease in the chest,abdomen, or pelvis. 3. Background of fine centrilobular nodularity, most concentrated in the lung apices, most commonly seen in smoking-related respiratorybronchiolitis. 4. Hepatic steatosis      INTERVAL HISTORY Katie Liu is a 74 y.o. female who has above history reviewed by me today presents for follow up visit for management of stage II right colon cancer She has good appetite and has gained weight.  No new concerns. Denis nausea vomiting ,abdominal pain, blood in stool.      Review of Systems  Constitutional:  Negative for appetite change, chills, fatigue, fever and unexpected weight change.  HENT:   Negative for hearing loss and voice change.   Eyes:  Negative for eye problems.  Respiratory:  Negative for chest tightness and cough.   Cardiovascular:  Negative for chest pain.  Gastrointestinal:  Negative for abdominal distention, abdominal pain and blood in stool.  Endocrine:  Negative for hot flashes.  Genitourinary:  Negative for difficulty urinating and frequency.   Musculoskeletal:  Positive for arthralgias.       Right knee TKR  Skin:  Negative for itching and rash.  Neurological:  Negative for extremity weakness.  Hematological:  Negative for adenopathy.  Psychiatric/Behavioral:  Negative for confusion.     MEDICAL HISTORY:  Past Medical History:  Diagnosis Date   Allergy    Anemia    Anxiety    Arthritis    Cancer (HCC)    colon   Complication of anesthesia    started to feel when having her cataract surgery   GERD (gastroesophageal reflux disease)    Hyperlipemia    Hypertension    Pre-diabetes    Vitamin D deficiency     SURGICAL HISTORY: Past Surgical History:  Procedure Laterality Date   ACHILLES TENDON REPAIR Bilateral    CESAREAN SECTION     COLON SURGERY     COLONOSCOPY WITH PROPOFOL  N/A 01/03/2021   Procedure: COLONOSCOPY WITH PROPOFOL ;  Surgeon: Toledo, Ladell POUR, MD;  Location: [facility];  Service: Gastroenterology;  Laterality: N/A;   ESOPHAGOGASTRODUODENOSCOPY (EGD) WITH PROPOFOL  N/A 01/03/2021   Procedure:  ESOPHAGOGASTRODUODENOSCOPY (EGD) WITH PROPOFOL ;  Surgeon: Toledo, Ladell POUR, MD;  Location: [facility];  Service: Gastroenterology;  Laterality: N/A;   EYE SURGERY     bilateral cataract   KNEE ARTHROSCOPY W/ ACL RECONSTRUCTION     LAPAROSCOPIC RIGHT HEMI COLECTOMY N/A 02/24/2021   Procedure: LAPAROSCOPIC RIGHT HEMI COLECTOMY;  Surgeon: Teresa Lonni HERO, MD;  Location: [facility];  Service: General;  Laterality: N/A;   TOTAL KNEE ARTHROPLASTY Right 12/21/2022   Procedure: TOTAL KNEE ARTHROPLASTY;  Surgeon: Lorelle Hussar, MD;  Location: [facility];  Service: Orthopedics;  Laterality: Right;  RNFA    SOCIAL HISTORY: Social History   Socioeconomic History   Marital status: Married    Spouse name: Jeralyn   Number of children: Not on file   Years of education: Not on file   Highest education level: Not on file   Occupational History   Not on file  Tobacco Use   Smoking status: Former    Current packs/day: 0.00    Average packs/day: 0.3 packs/day for 10.0 years (2.5 ttl pk-yrs)    Types: Cigarettes    Start date: 01/26/2012    Quit date: 06/22/2023    Years since quitting: 1.2   Smokeless tobacco: Never  Vaping Use   Vaping status: Never Used  Substance and Sexual Activity   Alcohol  use: Yes    Alcohol /week: 2.0 standard drinks of alcohol     Types: 2 Glasses of wine per week    Comment: occasional glass of wine.   Drug use: Never   Sexual activity: Not on file  Other Topics Concern   Not on file  Social History Narrative   Not on file   Social Drivers of Health   Tobacco Use: Medium Risk (09/09/2024)   Patient History    Smoking Tobacco Use: Former    Smokeless Tobacco Use: Never    Passive Exposure: Not on file  Financial Resource Strain: Low Risk  (08/03/2024)   Received from [facility]   Overall Financial Resource Strain (CARDIA)    Difficulty of Paying Living Expenses: Not hard at all  Food Insecurity: No Food Insecurity (08/03/2024)   Received from [facility]   Epic    Within the past 12 months, you worried that your food would run out before you got the money to buy more.: Never true    Within the past 12 months, the food you bought just didn't last and you didn't have money to get more.: Never true  Transportation Needs: No Transportation Needs (08/03/2024)   Received from [facility] - Transportation    In the past 12 months, has lack of transportation kept you from medical appointments or from getting medications?: No    Lack of Transportation (Non-Medical): No  Physical Activity: Not on file  Stress: Not on file  Social Connections: Not on file  Intimate Partner Violence: Not At Risk (12/21/2022)   Humiliation, Afraid, Rape, and Kick questionnaire    Fear of Current or Ex-Partner: No    Emotionally  Abused: No    Physically Abused: No    Sexually Abused: No  Depression (PHQ2-9): Not on file  Alcohol  Screen: Not on file  Housing: Low Risk  (08/03/2024)   Received from [facility]   Epic    In the last 12 months, was there a time when you were not able to pay the mortgage or rent on time?: No    In  the past 12 months, how many times have you moved where you were living?: 0    At any time in the past 12 months, were you homeless or living in a shelter (including now)?: No  Utilities: Not At Risk (08/03/2024)   Received from [facility]   Epic    In the past 12 months has the electric, gas, oil, or water company threatened to shut off services in your home?: No  Health Literacy: Not on file    FAMILY HISTORY: Family History  Problem Relation Age of Onset   Cancer Neg Hx    Breast cancer Neg Hx     ALLERGIES:  has no known allergies.  MEDICATIONS:  Current Outpatient Medications  Medication Sig Dispense Refill   atorvastatin  (LIPITOR) 40 MG tablet Take 40 mg by mouth daily.     cetirizine (ZYRTEC) 10 MG tablet Take 10 mg by mouth daily as needed for allergies.     Cholecalciferol  (VITAMIN D3) 25 MCG (1000 UT) CAPS Take 2,000 Units by mouth at bedtime.     cyanocobalamin  (VITAMIN B12) 1000 MCG tablet Take 500 mcg by mouth daily.     Dulaglutide 0.75 MG/0.5ML SOAJ Inject 0.75 mg into the skin.     fluticasone (FLONASE) 50 MCG/ACT nasal spray Place 2 sprays into both nostrils daily.     glucosamine-chondroitin 500-400 MG tablet Take 1 tablet by mouth daily at 6 (six) AM.     Multiple Vitamin (MULTIVITAMIN) capsule Take 1 capsule by mouth daily.     Omega-3 1000 MG CAPS Take 1,000 mg by mouth daily.     omeprazole (PRILOSEC) 40 MG capsule Take 40 mg by mouth daily.     sertraline  (ZOLOFT ) 25 MG tablet Take 25 mg by mouth as needed.     Turmeric (QC TUMERIC COMPLEX) 500 MG CAPS Take 1 tablet by mouth daily at 6 (six) AM.     aspirin  81 MG  chewable tablet Chew 81 mg by mouth daily. (Patient not taking: Reported on 09/09/2024)     No current facility-administered medications for this visit.     PHYSICAL EXAMINATION: ECOG PERFORMANCE STATUS: 1 - Symptomatic but completely ambulatory Vitals:   09/09/24 1330  BP: 119/87  Pulse: (!) 110  Resp: 18  Temp: 97.7 F (36.5 C)  SpO2: 97%   Filed Weights   09/09/24 1330  Weight: 190 lb 4.8 oz (86.3 kg)    Physical Exam Constitutional:      General: She is not in acute distress. HENT:     Head: Normocephalic and atraumatic.  Eyes:     General: No scleral icterus. Cardiovascular:     Rate and Rhythm: Normal rate and regular rhythm.     Heart sounds: Normal heart sounds.  Pulmonary:     Effort: Pulmonary effort is normal. No respiratory distress.  Abdominal:     General: Bowel sounds are normal. There is no distension.     Palpations: Abdomen is soft.  Musculoskeletal:        General: Normal range of motion.     Cervical back: Normal range of motion and neck supple.     Comments: Right knee s/p TKR,   Skin:    General: Skin is warm and dry.     Findings: No erythema or rash.  Neurological:     Mental Status: She is alert and oriented to person, place, and time. Mental status is at baseline.  Psychiatric:  Mood and Affect: Mood normal.     LABORATORY DATA:  I have reviewed the data as listed    Latest Ref Rng & Units 09/03/2024    8:16 AM 02/08/2024    8:14 AM 08/03/2023    9:11 AM  CBC  WBC 4.0 - 10.5 K/uL 7.3  7.7  14.8   Hemoglobin 12.0 - 15.0 g/dL 85.7  86.2  85.2   Hematocrit 36.0 - 46.0 % 42.1  40.3  43.9   Platelets 150 - 400 K/uL 247  247  273       Latest Ref Rng & Units 09/03/2024    8:16 AM 02/08/2024    8:14 AM 08/03/2023    9:10 AM  CMP  Glucose 70 - 99 mg/dL 872  846  89   BUN 8 - 23 mg/dL 17  19  17    Creatinine 0.44 - 1.00 mg/dL 9.14  9.11  9.16   Sodium 135 - 145 mmol/L 141  138  138   Potassium 3.5 - 5.1 mmol/L 4.5  3.8  3.9    Chloride 98 - 111 mmol/L 106  106  102   CO2 22 - 32 mmol/L 21  21  25    Calcium  8.9 - 10.3 mg/dL 89.8  9.6  9.1   Total Protein 6.5 - 8.1 g/dL 7.7  7.6  7.0   Total Bilirubin 0.0 - 1.2 mg/dL 0.7  1.0  1.1   Alkaline Phos 38 - 126 U/L 82  76  71   AST 15 - 41 U/L 37  27  20   ALT 0 - 44 U/L 38  26  33     RADIOGRAPHIC STUDIES: I have personally reviewed the radiological images as listed and agreed with the findings in the report. No results found.   "

## 2024-09-09 NOTE — Assessment & Plan Note (Addendum)
#  Ascending colon carcinoma with mucinous feature, pT3 pN0 cMx, Stage II disease, no high risk features,  Labs reviewed and discussed with patient. CEA normalized, likely due to smoke cessation.  Patient is doing very well clinically. She is close to 3 year after surgery.  June 2025 CT scan shows no signs of cancer recurrence. Continue surveillance. Follow up H&P labs in 6 months,  Repeat CT in 6 months

## 2024-09-09 NOTE — Assessment & Plan Note (Signed)
 Encourage her smoking cessation effort.  She gets CT scan for surveillance of colon cancer

## 2024-09-09 NOTE — Assessment & Plan Note (Signed)
 Recommend healthy diet and exercise.

## 2024-09-11 ENCOUNTER — Encounter: Payer: Self-pay | Admitting: Oncology

## 2025-03-02 ENCOUNTER — Other Ambulatory Visit

## 2025-03-09 ENCOUNTER — Inpatient Hospital Stay

## 2025-03-16 ENCOUNTER — Inpatient Hospital Stay: Admitting: Oncology
